# Patient Record
Sex: Female | Born: 1945 | Race: Black or African American | Hispanic: No | Marital: Single | Smoking: Never smoker
Health system: Southern US, Community
[De-identification: ages and names within clinical notes are randomized; demographics above are authoritative.]

## PROBLEM LIST (undated history)

## (undated) DIAGNOSIS — I5020 Unspecified systolic (congestive) heart failure: Secondary | ICD-10-CM

## (undated) DIAGNOSIS — D649 Anemia, unspecified: Secondary | ICD-10-CM

## (undated) DIAGNOSIS — E785 Hyperlipidemia, unspecified: Secondary | ICD-10-CM

## (undated) DIAGNOSIS — I2510 Atherosclerotic heart disease of native coronary artery without angina pectoris: Secondary | ICD-10-CM

## (undated) DIAGNOSIS — I255 Ischemic cardiomyopathy: Secondary | ICD-10-CM

## (undated) DIAGNOSIS — I10 Essential (primary) hypertension: Secondary | ICD-10-CM

## (undated) DIAGNOSIS — E119 Type 2 diabetes mellitus without complications: Secondary | ICD-10-CM

## (undated) HISTORY — DX: Type 2 diabetes mellitus without complications: E11.9

## (undated) HISTORY — DX: Anemia, unspecified: D64.9

## (undated) HISTORY — DX: Hyperlipidemia, unspecified: E78.5

## (undated) HISTORY — DX: Essential (primary) hypertension: I10

## (undated) HISTORY — DX: Unspecified systolic (congestive) heart failure: I50.20

## (undated) HISTORY — DX: Atherosclerotic heart disease of native coronary artery without angina pectoris: I25.10

## (undated) HISTORY — PX: CORONARY ANGIOPLASTY: SHX604

## (undated) HISTORY — DX: Ischemic cardiomyopathy: I25.5

## (undated) HISTORY — PX: CARDIAC CATHETERIZATION: SHX172

---

## 2015-12-27 HISTORY — PX: CORONARY ARTERY BYPASS GRAFT: SHX141

## 2016-06-16 ENCOUNTER — Ambulatory Visit (INDEPENDENT_AMBULATORY_CARE_PROVIDER_SITE_OTHER): Payer: Medicare Other | Admitting: Primary Care

## 2016-06-16 ENCOUNTER — Encounter: Payer: Self-pay | Admitting: Primary Care

## 2016-06-16 VITALS — BP 146/80 | HR 63 | Temp 98.0°F | Ht 61.75 in | Wt 166.8 lb

## 2016-06-16 DIAGNOSIS — Z7409 Other reduced mobility: Secondary | ICD-10-CM | POA: Diagnosis not present

## 2016-06-16 DIAGNOSIS — Z794 Long term (current) use of insulin: Secondary | ICD-10-CM

## 2016-06-16 DIAGNOSIS — E119 Type 2 diabetes mellitus without complications: Secondary | ICD-10-CM | POA: Diagnosis not present

## 2016-06-16 DIAGNOSIS — F418 Other specified anxiety disorders: Secondary | ICD-10-CM | POA: Diagnosis not present

## 2016-06-16 DIAGNOSIS — I2510 Atherosclerotic heart disease of native coronary artery without angina pectoris: Secondary | ICD-10-CM | POA: Diagnosis not present

## 2016-06-16 DIAGNOSIS — F329 Major depressive disorder, single episode, unspecified: Secondary | ICD-10-CM

## 2016-06-16 DIAGNOSIS — F419 Anxiety disorder, unspecified: Secondary | ICD-10-CM

## 2016-06-16 DIAGNOSIS — K219 Gastro-esophageal reflux disease without esophagitis: Secondary | ICD-10-CM

## 2016-06-16 NOTE — Progress Notes (Signed)
Subjective:    Patient ID: Megan Megan Howard, female    DOB: 12/12/1945, 70 y.o.   MRN: [id number]  HPI  Megan Megan Howard is a 70 year old female who presents today to establish care and discuss the problems mentioned below. Will obtain old records. [place] [facility] [place]. Two weeks ago she was living in [place] and was there for 1 week until moving to [place]. Her daughter is with her today as the patient is a poor historian. Her daughter doesn't know much about her medical history either.   1) Home Health Request: She was managed on home health in [place] and was provided with a home health nurse, physical/occupation therapy, home health aid. She needs help with medication management, ambulation, dressing, eating, physical conditioning. She uses a walker at home and is mostly home bound.   2) CAD/Essential Hypertension/CHF/MI: History of triple bypass surgery in June 2017. History of hypertension for years. She Currently managed on carvedilol 25 mg BID, enalapril 10 mg, hydralazine 25 mg. Also managed on lasix 40 mg daily and spironolactone 25 mg daily since her bypass in June 2017. She was following with cardiology in [place] but has not seen anyone recently.   She does have a history of lower extremity edema and is managed on Lasix and spironolactone. She sits in a dependent position for Megan Howard of the day and is not elevating her legs. Her daughter believes her swelling has become worse over the last several days. She is unsure if she was every diagnosed with CHF. She denies cough exertional dyspnea.  3) Type 2 Diabetes: Diagnosed years ago. Currently She checks her blood sugars 2-3 times daily mostly after meals. Her readings are running 150-250. She is managed on Lantus 10 units HS. She's had blood work in [place] 2-3 weeks ago and is unsure of her results. Her family is working to have her improve her diet but this has been a struggle.   She is managed on Megace for appetite stimulation  as she was not eating well in [place] after her bypass surgery in June 2017. She will snack a lot late at night and eats a lot of processed carbohydrates including biscuits. Her family is struggling with her diet as she refuses to eat what they prepare.   4) GERD: Currently managed on pantoprazole 40 mg. She will experience symptoms of esophageal burning, belching, epigastric discomfort without her medication.  5) Anticoagulation use: Currently managed on apixaban 5 mg. She is unsure of why she's taking this medication. She denies prior history of atrial fibrillation or CVA. She did undergo bypass surgery in June 2017.  6) Anxiety and Depression: Currently managed on Lexapro 10 mg for which was recently changed from Citalopram 2 weeks ago. She was previously managed on Lexapro with improvement but was switched to Citalopram during her hospitalization in June. Her daughter has noticed that she's screaming out her sister's name during the day and night. Her family is interested in therapy in order to restore her mood.   Review of Systems  Constitutional: Negative for unexpected weight change.  Respiratory: Negative for shortness of breath.   Cardiovascular: Positive for leg swelling. Negative for chest pain.  Gastrointestinal:       GERD  Endocrine: Negative for polyuria.  Genitourinary:       Post menopausal  Musculoskeletal: Negative for arthralgias.  Skin: Negative for color change and wound.  Neurological: Positive for weakness. Negative for dizziness and headaches.  Psychiatric/Behavioral: Positive for  sleep disturbance. The patient is nervous/anxious.        Depression       No past medical history on file.   Social History   Social History  . Marital status: Single    Spouse name: N/A  . Number of children: N/A  . Years of education: N/A   Occupational History  . Not on file.   Social History Main Topics  . Smoking status: Never Smoker  . Smokeless tobacco: Not on file    . Alcohol use No  . Drug use: Unknown  . Sexual activity: Not on file   Other Topics Concern  . Not on file   Social History Narrative   Single.   Moved from [place].   Now lives with daughter.     No past surgical history on file.  No family history on file.  Not on File  No current outpatient prescriptions on file prior to visit.   No current facility-administered medications on file prior to visit.     BP (!) 146/80   Pulse 63   Temp 98 F (36.7 C) (Oral)   Ht 5' 1.75" (1.568 m)   Wt 166 lb 12.8 oz (75.7 kg)   SpO2 93%   BMI 30.76 kg/m    Objective:   Physical Exam  Constitutional: She is oriented to person, place, and time. She appears well-nourished.  Neck: Neck supple.  Cardiovascular: Normal rate and regular rhythm.   Moderate lower extremity edema, trace pitting.  Pulmonary/Chest: Effort normal and breath sounds normal.  Musculoskeletal:  Decreased ROM to bilateral lower extremities, uses walker.  Neurological: She is alert and oriented to person, place, and time.  Skin: Skin is warm and dry.  Psychiatric:  Tearful during exam. Non verbal Megan Howard of the time. Poor eye contact.          Assessment & Plan:

## 2016-06-16 NOTE — Progress Notes (Signed)
Pre visit review using our clinic review tool, if applicable. No additional management support is needed unless otherwise documented below in the visit note. 

## 2016-06-16 NOTE — Patient Instructions (Addendum)
You will be contacted regarding your referral to Cardiology, therapy, and home health.  Please let us know if you have not heard back within one week.   Increase her furosemide (Lasix). Take 1 tablet by mouth twice daily for 3 days, then reduce down to 1 tablet by mouth once daily. Please notify me if no improvement.  Hold her megestrol 40 mg tablets as this is likely contributing to her eating binges at night.  I will obtain records from [place] and [place] and will be in touch once received.  It was a pleasure to meet you today! Please don't hesitate to call me with any questions. Welcome to [facility]!

## 2016-06-17 ENCOUNTER — Encounter: Payer: Self-pay | Admitting: Primary Care

## 2016-06-17 DIAGNOSIS — E119 Type 2 diabetes mellitus without complications: Secondary | ICD-10-CM | POA: Insufficient documentation

## 2016-06-17 DIAGNOSIS — Z7409 Other reduced mobility: Secondary | ICD-10-CM | POA: Insufficient documentation

## 2016-06-17 DIAGNOSIS — K219 Gastro-esophageal reflux disease without esophagitis: Secondary | ICD-10-CM | POA: Insufficient documentation

## 2016-06-17 DIAGNOSIS — F419 Anxiety disorder, unspecified: Secondary | ICD-10-CM

## 2016-06-17 DIAGNOSIS — I2510 Atherosclerotic heart disease of native coronary artery without angina pectoris: Secondary | ICD-10-CM | POA: Insufficient documentation

## 2016-06-17 DIAGNOSIS — F329 Major depressive disorder, single episode, unspecified: Secondary | ICD-10-CM | POA: Insufficient documentation

## 2016-06-17 NOTE — Assessment & Plan Note (Signed)
Significant decline in ADL's since bypass in June 2017. She would benefit from PT/OT for strength training. She also needs assistance with medications and ADL's. Referral placed to home health.

## 2016-06-17 NOTE — Assessment & Plan Note (Signed)
Managed on PPI, does experience symptoms of esophageal reflux without medication. Will obtain records, would like to get her on reduced dose if possible.

## 2016-06-17 NOTE — Assessment & Plan Note (Addendum)
Endorses labs drawn in [place] 2 weeks ago. Will obtain records from [place]. Continue Lantus 10 units at bedtime. Will have them hold Megace as this could be contributing to late night snacking.

## 2016-06-17 NOTE — Assessment & Plan Note (Addendum)
History of bypass in June 2017. Will establish her with a cardiologist locally. Continue beta blocker, anticoagulation, BP management. Poor historian, will obtain records for additional information. Will have her increase lasix for 3 days, then return to normal dose. Daughter will update if no improvement.

## 2016-06-17 NOTE — Assessment & Plan Note (Signed)
Recently switched from citalopram to lexapro 2 weeks ago. Did well on lexparo in the past. She does exhibit symptoms of depression today. Also question some anxiety, maybe other psychological disorders. Will continue Lexapro for now. Referral placed to therapy for further evaluation.

## 2016-06-23 ENCOUNTER — Telehealth: Payer: Self-pay | Admitting: Primary Care

## 2016-06-23 NOTE — Telephone Encounter (Signed)
Please call patient's daughter Camelia Engerri and notify her that we did not receive a signed medical release form. This must be completed in order for us to access records. Please have either her or her husband come by and sign. Please have the front office place this on my desk.

## 2016-06-23 NOTE — Telephone Encounter (Signed)
Gave medical release form to [place].

## 2016-06-24 ENCOUNTER — Telehealth: Payer: Self-pay

## 2016-06-24 NOTE — Telephone Encounter (Signed)
Received a call from [place] [facility]. The pt's daughter wanted home health to go through them. They are aware of the fact that she is still showing to be under the care of a different HH. That is the one she had in [place].

## 2016-06-24 NOTE — Telephone Encounter (Signed)
[facility] and patients daughter is aware. She needed to [facility] and have her mother discharged so she get services from a New agency.

## 2016-06-25 ENCOUNTER — Other Ambulatory Visit
Admission: RE | Admit: 2016-06-25 | Discharge: 2016-06-25 | Disposition: A | Discharge status: Home / Self Care | Payer: Medicare Other | Source: Ambulatory Visit | Attending: Internal Medicine | Admitting: Internal Medicine

## 2016-06-25 ENCOUNTER — Telehealth: Payer: Self-pay | Admitting: Primary Care

## 2016-06-25 ENCOUNTER — Ambulatory Visit (INDEPENDENT_AMBULATORY_CARE_PROVIDER_SITE_OTHER): Payer: Medicare Other | Admitting: Internal Medicine

## 2016-06-25 ENCOUNTER — Telehealth: Payer: Self-pay | Admitting: *Deleted

## 2016-06-25 ENCOUNTER — Encounter: Payer: Self-pay | Admitting: Internal Medicine

## 2016-06-25 ENCOUNTER — Telehealth: Payer: Self-pay | Admitting: Internal Medicine

## 2016-06-25 VITALS — BP 108/78 | HR 60 | Ht 61.0 in | Wt 167.0 lb

## 2016-06-25 DIAGNOSIS — I2510 Atherosclerotic heart disease of native coronary artery without angina pectoris: Secondary | ICD-10-CM | POA: Diagnosis not present

## 2016-06-25 DIAGNOSIS — F32A Depression, unspecified: Secondary | ICD-10-CM

## 2016-06-25 DIAGNOSIS — Z79899 Other long term (current) drug therapy: Secondary | ICD-10-CM | POA: Diagnosis not present

## 2016-06-25 DIAGNOSIS — I480 Paroxysmal atrial fibrillation: Secondary | ICD-10-CM | POA: Diagnosis not present

## 2016-06-25 DIAGNOSIS — F329 Major depressive disorder, single episode, unspecified: Secondary | ICD-10-CM

## 2016-06-25 DIAGNOSIS — I255 Ischemic cardiomyopathy: Secondary | ICD-10-CM | POA: Insufficient documentation

## 2016-06-25 LAB — CBC WITH DIFFERENTIAL/PLATELET
Basophils Absolute: 0 10*3/uL (ref 0–0.1)
Basophils Relative: 1 %
EOS PCT: 1 %
Eosinophils Absolute: 0.1 10*3/uL (ref 0–0.7)
HCT: 32.3 % — ABNORMAL LOW (ref 35.0–47.0)
HEMOGLOBIN: 10.3 g/dL — AB (ref 12.0–16.0)
LYMPHS ABS: 0.6 10*3/uL — AB (ref 1.0–3.6)
LYMPHS PCT: 10 %
MCH: 25.6 pg — AB (ref 26.0–34.0)
MCHC: 31.9 g/dL — ABNORMAL LOW (ref 32.0–36.0)
MCV: 80.1 fL (ref 80.0–100.0)
Monocytes Absolute: 0.4 10*3/uL (ref 0.2–0.9)
Monocytes Relative: 7 %
NEUTROS PCT: 81 %
Neutro Abs: 4.6 10*3/uL (ref 1.4–6.5)
Platelets: 143 10*3/uL — ABNORMAL LOW (ref 150–440)
RBC: 4.03 MIL/uL (ref 3.80–5.20)
RDW: 23.8 % — ABNORMAL HIGH (ref 11.5–14.5)
WBC: 5.6 10*3/uL (ref 3.6–11.0)

## 2016-06-25 LAB — COMPREHENSIVE METABOLIC PANEL
ALT: 13 U/L — ABNORMAL LOW (ref 14–54)
AST: 27 U/L (ref 15–41)
Albumin: 2.8 g/dL — ABNORMAL LOW (ref 3.5–5.0)
Alkaline Phosphatase: 156 U/L — ABNORMAL HIGH (ref 38–126)
Anion gap: 9 (ref 5–15)
BUN: 17 mg/dL (ref 6–20)
CHLORIDE: 102 mmol/L (ref 101–111)
CO2: 23 mmol/L (ref 22–32)
Calcium: 8.9 mg/dL (ref 8.9–10.3)
Creatinine, Ser: 1.18 mg/dL — ABNORMAL HIGH (ref 0.44–1.00)
GFR calc Af Amer: 52 mL/min — ABNORMAL LOW (ref >60)
GFR, EST NON AFRICAN AMERICAN: 45 mL/min — AB (ref >60)
Glucose, Bld: 157 mg/dL — ABNORMAL HIGH (ref 65–99)
POTASSIUM: 4.4 mmol/L (ref 3.5–5.1)
SODIUM: 134 mmol/L — AB (ref 135–145)
Total Bilirubin: 2.5 mg/dL — ABNORMAL HIGH (ref 0.3–1.2)
Total Protein: 8.2 g/dL — ABNORMAL HIGH (ref 6.5–8.1)

## 2016-06-25 LAB — BRAIN NATRIURETIC PEPTIDE: B Natriuretic Peptide: >4500 pg/mL — ABNORMAL HIGH (ref 0.0–100.0)

## 2016-06-25 MED ORDER — FUROSEMIDE 40 MG PO TABS: 40.0000 mg | ORAL_TABLET | Freq: Two times a day (BID) | ORAL | 5 refills | Status: DC

## 2016-06-25 NOTE — Patient Instructions (Addendum)
Medication Instructions:  Continue current medications.   Labwork: Your physician recommends that you return for lab work in: TODAY. (CMP, CBC, BNP)   Testing/Procedures: Your physician has requested that you have an echocardiogram. Echocardiography is a painless test that uses sound waves to create images of your heart. It provides your doctor with information about the size and shape of your heart and how well your heart's chambers and valves are working. This procedure takes approximately one hour. There are no restrictions for this procedure.  I WILL CALL YOU ABOUT THE TIME AND DATE FOR YOU ECHOCARDIOGRAM.  Follow-Up: Your physician recommends that you schedule a follow-up appointment in: 1 MONTH WITH DR END.   Any Other Special Instructions Will Be Listed Below (If Applicable). Echocardiogram An echocardiogram, or echocardiography, uses sound waves (ultrasound) to produce an image of your heart. The echocardiogram is simple, painless, obtained within a short period of time, and offers valuable information to your health care provider. The images from an echocardiogram can provide information such as:  Evidence of coronary artery disease (CAD).  Heart size.  Heart muscle function.  Heart valve function.  Aneurysm detection.  Evidence of a past heart attack.  Fluid buildup around the heart.  Heart muscle thickening.  Assess heart valve function. Tell a health care provider about:  Any allergies you have.  All medicines you are taking, including vitamins, herbs, eye drops, creams, and over-the-counter medicines.  Any problems you or family members have had with anesthetic medicines.  Any blood disorders you have.  Any surgeries you have had.  Any medical conditions you have.  Whether you are pregnant or may be pregnant. What happens before the procedure? No special preparation is needed. Eat and drink normally. What happens during the procedure?  In order to  produce an image of your heart, gel will be applied to your chest and a wand-like tool (transducer) will be moved over your chest. The gel will help transmit the sound waves from the transducer. The sound waves will harmlessly bounce off your heart to allow the heart images to be captured in real-time motion. These images will then be recorded.  You may need an IV to receive a medicine that improves the quality of the pictures. What happens after the procedure? You may return to your normal schedule including diet, activities, and medicines, unless your health care provider tells you otherwise. This information is not intended to replace advice given to you by your health care provider. Make sure you discuss any questions you have with your health care provider. Document Released: 07/11/2000 Document Revised: 03/01/2016 Document Reviewed: 03/21/2013 Elsevier Interactive Patient Education  2017 [employer]     If you need a refill on your cardiac medications before your next appointment, please call your pharmacy.

## 2016-06-25 NOTE — Telephone Encounter (Signed)
S/w with scheduling at Hospital and echo scheduled for 07/03/16 at 11:00.  S/w patient's daughter, ok per DPR, and she verbalized understanding of date and time of echo and to [facility] am [facility].

## 2016-06-25 NOTE — Progress Notes (Signed)
New Outpatient Visit Date: 06/25/2016  Referring Provider: Doreene NestKatherine K Clark, NP [address] [place], [place] [address]  Chief Complaint: Establish cardiology care  HPI:  [name] is a 70 y.o. year-old female with history of coronary artery disease status post CABG in 12/2015 in [place], possible cardiomyopathy, type 2 diabetes mellitus, and GERD, who has been referred by Dr. Chestine Sporelark for management of cardiovascular disease. The patient is accompanied by her daughter, who provides most of the history, as [name] is very tearful and only intermittently answers questions. She was first found to have heart problems in June after struggling with worsening "heartburn". This ultimately led to a cardiac catheterization with unsuccessful stent placement in the setting of an STEMI. The patient was then referred for CABG, receiving SVG to OM2 and SVG to RCA. Her postoperative course has been complicated by several issues. It sounds as though she was rehospitalized in [place] due to electrolyte disturbances from poor oral intake. She was ultimately discharged to inpatient rehabilitation, where she remained for about a month. After being discharged about a month ago, the patient spent one week in [place] before coming to [place]. She has been here for about 2-3 weeks and plans to reside here with her daughter.  The patient has not had any recent chest pain or shortness of breath. She has fluctuating leg swelling, which seems to be near its baseline today. The patient does not sleep in bed, often lying on the couch or on the floor. Her daughter is unsure of why this is. It should be noted that the patient sleeps very poorly at night and frequently cries out. She also reports a fall about one week ago but does not recall the circumstances. Her daughter noticed a small bump on her head after this event but did not identify any other significant injuries. The patient denies palpitations,  lightheadedness, and syncope. She is compliant with her medications, including aspirin and apixiban. It appears that anticoagulation was started due to postoperative atrial fibrillation following CABG. Reports from her cardiologist in the [place] suggests that subsequent Holter monitor did not show atrial fibrillation.  The patient has had a poor appetite and remains on megestrol. Of note, this was decreased from twice a day to once daily after her PCP visit last week. Patient reports frequent abdominal fullness and nausea.  --------------------------------------------------------------------------------------------------  Cardiovascular History & Procedures: Cardiovascular Problems:  Coronary artery disease  Ischemic cardiomyopathy (LVEF 67% by echo on 02/13/16)  Postoperative atrial fibrillation  Risk Factors:  Known coronary artery disease, diabetes mellitus, family history, and age greater than 6165  Cath/PCI:  Left heart cath 12/2015 in [place]. Results not available.  CV Surgery:  CABG (12/2015 in [place]): SVG to OM 2 and SVG to RCA.  EP Procedures and Devices:  Holter monitor (2017): Reportedly no evidence of atrial fibrillation.  Non-Invasive Evaluation(s):  TTE (02/13/16): Normal LV size and function with LVEF of 67%. Mild LVH present. 2+ tricuspid and 1+ pulmonic regurgitation present. Mild pulmonary hypertension.  Recent CV Pertinent Labs: See below --------------------------------------------------------------------------------------------------  Past Medical History:  Diagnosis Date  . Anemia   . Cardiomyopathy, ischemic   . Coronary artery disease   . Diabetes mellitus without complication (HCC)   . Dyslipidemia   . Hypertension     Past Surgical History:  Procedure Laterality Date  . CARDIAC CATHETERIZATION    . CORONARY ANGIOPLASTY    . CORONARY ARTERY BYPASS GRAFT  12/2015   CABG x 2  Outpatient Encounter Prescriptions as of 06/25/2016    Medication Sig  . apixaban (ELIQUIS) 5 MG TABS tablet Take 5 mg by mouth 2 (two) times daily.  Marland Kitchen. aspirin EC 81 MG tablet Take 81 mg by mouth daily.  . carvedilol (COREG) 25 MG tablet Take 25 mg by mouth 2 (two) times daily with a meal.  . enalapril (VASOTEC) 10 MG tablet Take 10 mg by mouth daily.  Marland Kitchen. escitalopram (LEXAPRO) 10 MG tablet Take 10 mg by mouth daily.  . furosemide (LASIX) 40 MG tablet Take 40 mg by mouth daily.  . hydrALAZINE (APRESOLINE) 25 MG tablet Take 25 mg by mouth 2 (two) times daily.  . insulin glargine (LANTUS) 100 UNIT/ML injection Inject 10 Units into the skin at bedtime.  Marland Kitchen. latanoprost (XALATAN) 0.005 % ophthalmic solution Place 1 drop into both eyes at bedtime.  . Magnesium Oxide 400 MG CAPS Take 400 mg by mouth daily.  . megestrol (MEGACE) 40 MG tablet Take 40 mg by mouth daily.  . pantoprazole (PROTONIX) 40 MG tablet Take 40 mg by mouth daily.  . potassium chloride SA (K-DUR,KLOR-CON) 20 MEQ tablet Take 20 mEq by mouth 2 (two) times daily.  Marland Kitchen. spironolactone (ALDACTONE) 25 MG tablet Take 25 mg by mouth daily.   No facility-administered encounter medications on file as of 06/25/2016.     Allergies: Shellfish allergy  Social History   Social History  . Marital status: Single    Spouse name: N/A  . Number of children: N/A  . Years of education: N/A   Occupational History  . Not on file.   Social History Main Topics  . Smoking status: Never Smoker  . Smokeless tobacco: Never Used  . Alcohol use No  . Drug use: No  . Sexual activity: Not on file   Other Topics Concern  . Not on file   Social History Narrative   Single.   Moved from [place].   Now lives with daughter.     Family History  Problem Relation Age of Onset  . Heart attack Sister   . Arrhythmia Brother     s/p AICD  . Cardiomyopathy Brother   . Diabetes type II Brother     Review of Systems: A 12-system review of systems was performed and was negative except as noted in the  HPI.  --------------------------------------------------------------------------------------------------  Physical Exam: BP 108/78 (BP Location: Right Arm, Patient Position: Sitting, Cuff Size: Normal)   Pulse 60   Ht 5\' 1"  (1.549 m)   Wt 167 lb (75.8 kg)   BMI 31.55 kg/m   General:  Overweight, elderly woman seated in a wheelchair. She is tearful. She is accompanied by her daughter. HEENT: Mild conjunctival pallor.  Moist mucous membranes.  OP clear. Neck: Supple without lymphadenopathy, thyromegaly, or carotid bruit. JVP is to the angle of the mandible seated upright with positive HJR. Lungs: Diminished breath sounds at the left lung base. No wheezes or crackles. Heart: Regular rate and rhythm with 2/6 holosystolic murmur heard across the precordium. No rubs or gallops. Abd: Bowel sounds present.  Soft, NT/ND. Unable to assess HSM due to body habitus and patient's inability to move from wheelchair to exam table. Ext: 2+ edema to the knees.  Radial pulses are 2+. Pedal pulses are trace but may be obscured by overlying edema. Skin: Dry, cracked skin noted on both lower extremities Neuro: CNIII-XII intact.  Patient not cooperative with further examination, though she spontaneously moves both upper and lower extremities. Psych: Depressed  mood with very flat affect. Patient is tearful throughout much of the visit.  EKG:  Normal sinus rhythm with left bundle branch block. No prior tracing available for comparison.  Outside labs (06/03/16): CMP: Sodium 136, potassium 4.3, chloride 100, CO2 21, BUN 21, creatinine 1.4, glucose 191, calcium 9.0, AST 32, ALT 17, alkaline phosphatase 152, total bilirubin 3.0, total protein 7.3, albumin 3.1  Hemoglobin A1c 6.8  TSH 3.34  White blood cell count 6.7, hemoglobin 9.4, hematocrit 30.7, platelets 215  --------------------------------------------------------------------------------------------------  ASSESSMENT AND PLAN: Coronary artery disease  status post CABG The patient does not have any obvious symptoms to suggest worsening coronary insufficiency. Her EKG today demonstrates a left bundle branch block, which is old based on review of her prior cardiology note. We will continue her current medication regimen, though it is unclear as to why she is not currently on a statin. We will check a complete metabolic panel today and consider addition of at least moderate intensity statin therapy given her history of CAD.  Ischemic cardiomyopathy The patient appears grossly volume overloaded on exam today. It is difficult to assess her functional status due to her profound psychomotor retardation, which is driven predominantly by depression. Echocardiogram following CABG supposedly showed preserved LV function, though the patient is currently on multiple heart failure medications including carvedilol, enalapril, and spironolactone. I am most concerned about right heart failure leading to her fluid retention. Her GI symptoms, including poor appetite, as well as elevated alkaline phosphatase and total bilirubin may be a reflection of venous congestion from right heart failure. We will obtain a transthoracic echocardiogram as soon as possible as well as request further records from her prior cardiologist in [place]. We will also check a CBC, CMP, and BNP today. We will not make any medication changes at this time.  Paroxysmal atrial fibrillation EKG today demonstrates sinus rhythm. The patient reportedly had postoperative atrial fibrillation without evidence of recurrence on Holter monitor by her prior cardiologist. We will request a complete record of her prior cardiology care. I would favor discontinuation of apxiban, given that her isolated episode of postoperative atrial fibrillation as well as her high risk for complications on anticoagulation. We will discuss this further after receiving her records.  Depression This seems to be the greatest barrier  to the patient's recovery. She is tearful throughout our visit today and exhibits a very flat affect. She is currently on Lexapro. I encouraged her and her daughter to seek out a psychiatrist or psychologist for further evaluation. We will also consider decreasing carvedilol after completion of her echo and labs, as this can sometimes exacerbate depression.  Follow-up: Return to clinic in one month.  Greater than 60 minutes was spent interviewing the patient and her daughter, examining the patient, and reviewing her outside records. More than 50% of this time was spent face-to-face with the patient and her daughter.  Yvonne Kendall, MD 06/25/2016 1:09 PM

## 2016-06-25 NOTE — Telephone Encounter (Signed)
I spoke with the patient's daughter regarding the results of her labs.  Given markedly elevated BNP and significant volume overload, we will increase furosemide to 40 mg BID.  I will also decrease carvedilol to 12.5 mg BID, given fatigue and depressed mood in the setting of low resting heart rate and blood pressure.  We will proceed with echocardiogram as previously scheduled and will discuss additional medication changes after reviewing her echo and outside records.  Yvonne Kendallhristopher Yoltzin Ransom, MD [facility] Pager: [phone]

## 2016-06-25 NOTE — Telephone Encounter (Signed)
Referral printed and placed in Amy's inbox. Amy, please return this to [place] when signed. Thanks!

## 2016-06-25 NOTE — Telephone Encounter (Signed)
[facility] - she need to have the referral cosigned by Dr Ermalene SearingBedsole since she has medicare Can you print the referral, have Dr Ermalene SearingBedsole sign it, and fax it back to [phone]  cb number for Efraim Kaufmannmelissa is [phone]  thank you

## 2016-06-26 NOTE — Telephone Encounter (Signed)
Signed and in my outbox.

## 2016-06-27 NOTE — Telephone Encounter (Signed)
Faxed the printed referral with signature as [facility] to [facility] at [phone]

## 2016-06-29 DIAGNOSIS — I10 Essential (primary) hypertension: Secondary | ICD-10-CM | POA: Diagnosis not present

## 2016-06-29 DIAGNOSIS — F329 Major depressive disorder, single episode, unspecified: Secondary | ICD-10-CM | POA: Diagnosis not present

## 2016-06-29 DIAGNOSIS — I2510 Atherosclerotic heart disease of native coronary artery without angina pectoris: Secondary | ICD-10-CM

## 2016-06-29 DIAGNOSIS — E119 Type 2 diabetes mellitus without complications: Secondary | ICD-10-CM | POA: Diagnosis not present

## 2016-07-01 ENCOUNTER — Telehealth: Payer: Self-pay | Admitting: Internal Medicine

## 2016-07-01 ENCOUNTER — Telehealth: Payer: Self-pay

## 2016-07-01 DIAGNOSIS — Z79899 Other long term (current) drug therapy: Secondary | ICD-10-CM

## 2016-07-01 MED ORDER — FUROSEMIDE 40 MG PO TABS: 80.0000 mg | ORAL_TABLET | Freq: Two times a day (BID) | ORAL | 5 refills | Status: DC

## 2016-07-01 NOTE — Telephone Encounter (Signed)
Home health nurse calling stating we upped her dose on Lasik last week  But it doesn't seem to be helping Pt legs are so heavy she is having an issue with walking about and would like to know what to do  170/110 was pt BP today   Please advise

## 2016-07-01 NOTE — Telephone Encounter (Signed)
[facility] Health RN), this was her first visit today to pt. She took measurements of legs today for first time. Patient and daughter still say legs are very heavy.  Per daughter, pt is NOT up going to the bathroom very often since recent lasix med changes and does not get up to move around very much at all. Denies SOB. BP 142/78 on Sunday when Home Health therapy was there. [facility] Nurse stated patient still seems very depressed. Patient is not up and moving around very much. Daughter is dispensing medication to patient as prescribed. No daily weights have been taken. Home health RN stated she will call the patient/family to see if they have a scale and to start daily weights.  Routing to Dr End for advice.

## 2016-07-01 NOTE — Telephone Encounter (Signed)
Bennetta LaosNancy Wilson OT with [facility] left v/m that pt fell at home on 06/29/16; pts daughter thinks pt got her feet hung up in bed sheets hanging off the bed; no apparent injury. FYI to Mayra ReelKate Clark NP.

## 2016-07-01 NOTE — Telephone Encounter (Signed)
S/w  Dr End who ordered for pt to increase furosemide to 80 mg BID, repeat BMP this week, and come for appt next Tuesday to see Dr End.  Left detailed message with daughter, Camelia Engerri, ok per DPR, with medication instructions and to call back to schedule repeat blood work and appt.

## 2016-07-01 NOTE — Telephone Encounter (Signed)
Received incoming call from daughter. Instructions for lasix, lab work, and appt given. She verbalized understanding. Patient will get BMP on Thursday when she is at the hospital for the echo. Appt scheduled for 07/08/16 at 2:45 pm to see Dr End.

## 2016-07-01 NOTE — Telephone Encounter (Signed)
S/w [facility]. Gave her Dr Serita KyleEnd's recommendations as well.  She read back and verbalized understanding. She will let the patient and daughter know as well.

## 2016-07-02 ENCOUNTER — Telehealth: Payer: Self-pay | Admitting: Primary Care

## 2016-07-02 NOTE — Telephone Encounter (Signed)
Noted. Please call and check on patient.

## 2016-07-02 NOTE — Telephone Encounter (Signed)
Message left for patient to return my call.  

## 2016-07-02 NOTE — Telephone Encounter (Signed)
Rec'd from Wandra ArthursLeonard Barrow Bourgeois MD forward 13 pages to NP Chestine Sporelark

## 2016-07-03 ENCOUNTER — Other Ambulatory Visit
Admission: RE | Admit: 2016-07-03 | Discharge: 2016-07-03 | Disposition: A | Discharge status: Home / Self Care | Payer: Medicare Other | Source: Ambulatory Visit | Attending: Internal Medicine | Admitting: Internal Medicine

## 2016-07-03 ENCOUNTER — Ambulatory Visit
Admission: RE | Admit: 2016-07-03 | Discharge: 2016-07-03 | Disposition: A | Discharge status: Home / Self Care | Payer: Medicare Other | Source: Ambulatory Visit | Attending: Internal Medicine | Admitting: Internal Medicine

## 2016-07-03 DIAGNOSIS — I071 Rheumatic tricuspid insufficiency: Secondary | ICD-10-CM | POA: Insufficient documentation

## 2016-07-03 DIAGNOSIS — I255 Ischemic cardiomyopathy: Secondary | ICD-10-CM | POA: Insufficient documentation

## 2016-07-03 DIAGNOSIS — I119 Hypertensive heart disease without heart failure: Secondary | ICD-10-CM | POA: Insufficient documentation

## 2016-07-03 DIAGNOSIS — I340 Nonrheumatic mitral (valve) insufficiency: Secondary | ICD-10-CM | POA: Diagnosis not present

## 2016-07-03 DIAGNOSIS — I2510 Atherosclerotic heart disease of native coronary artery without angina pectoris: Secondary | ICD-10-CM | POA: Insufficient documentation

## 2016-07-03 DIAGNOSIS — Z79899 Other long term (current) drug therapy: Secondary | ICD-10-CM | POA: Diagnosis not present

## 2016-07-03 DIAGNOSIS — E119 Type 2 diabetes mellitus without complications: Secondary | ICD-10-CM | POA: Diagnosis not present

## 2016-07-03 LAB — BASIC METABOLIC PANEL
ANION GAP: 12 (ref 5–15)
BUN: 19 mg/dL (ref 6–20)
CHLORIDE: 101 mmol/L (ref 101–111)
CO2: 21 mmol/L — AB (ref 22–32)
Calcium: 9 mg/dL (ref 8.9–10.3)
Creatinine, Ser: 1.31 mg/dL — ABNORMAL HIGH (ref 0.44–1.00)
GFR calc Af Amer: 46 mL/min — ABNORMAL LOW (ref >60)
GFR calc non Af Amer: 40 mL/min — ABNORMAL LOW (ref >60)
GLUCOSE: 240 mg/dL — AB (ref 65–99)
POTASSIUM: 3.9 mmol/L (ref 3.5–5.1)
Sodium: 134 mmol/L — ABNORMAL LOW (ref 135–145)

## 2016-07-03 NOTE — Telephone Encounter (Signed)
I've not received records. Can someone please check on the status? I requested them from both [place] and [place].

## 2016-07-03 NOTE — Telephone Encounter (Signed)
Spoken to patient's daughter. She stated that patient is okay.  Patient's daughter wanted to know have Megan Howard received patient's medical records yet. She would like to know what is the next step and when should patient come see Megan Howard again.

## 2016-07-03 NOTE — Progress Notes (Signed)
*  PRELIMINARY RESULTS* Echocardiogram 2D Echocardiogram has been performed.  Cristela BlueHege, Taesean Reth 07/03/2016, 11:46 AM

## 2016-07-03 NOTE — Telephone Encounter (Signed)
Per DPR, left detal message for patient of Kate's comments.

## 2016-07-04 NOTE — Telephone Encounter (Signed)
Noted. Received records. Reviewed scanned records.

## 2016-07-04 NOTE — Telephone Encounter (Signed)
I called the offices where we requested records.  Dr.Jones office said they received the request,but wouldn't let me know when records would be sent.  Dr.Freyou's office said they faxed the records to us on 06/25/16.  [facility] and [facility] returned them to us today.  I'll put them in Kate's in box.  Dr.Braunsteins's office faxed records to us today.  I'll put them in Kate's in box.

## 2016-07-08 ENCOUNTER — Encounter: Payer: Self-pay | Admitting: Internal Medicine

## 2016-07-08 ENCOUNTER — Ambulatory Visit (INDEPENDENT_AMBULATORY_CARE_PROVIDER_SITE_OTHER): Payer: Medicare Other | Admitting: Internal Medicine

## 2016-07-08 VITALS — BP 163/88 | HR 65 | Ht 62.0 in | Wt 153.8 lb

## 2016-07-08 DIAGNOSIS — I10 Essential (primary) hypertension: Secondary | ICD-10-CM

## 2016-07-08 DIAGNOSIS — I5021 Acute systolic (congestive) heart failure: Secondary | ICD-10-CM

## 2016-07-08 DIAGNOSIS — Z79899 Other long term (current) drug therapy: Secondary | ICD-10-CM

## 2016-07-08 DIAGNOSIS — I480 Paroxysmal atrial fibrillation: Secondary | ICD-10-CM

## 2016-07-08 DIAGNOSIS — I2510 Atherosclerotic heart disease of native coronary artery without angina pectoris: Secondary | ICD-10-CM

## 2016-07-08 DIAGNOSIS — I255 Ischemic cardiomyopathy: Secondary | ICD-10-CM

## 2016-07-08 NOTE — Progress Notes (Signed)
Follow-up Outpatient Visit Date: 07/08/2016  Chief Complaint: Follow-up leg swelling  HPI:  Megan Howard is a 70 y.o. year-old female with history of coronary artery disease status post CABG in 12/2015 in [place], possible cardiomyopathy, type 2 diabetes mellitus, and GERD, who presents for follow-up of leg swelling due to systolic heart failure. I first met the patient on 06/25/16, a which time she complained of significant leg swelling as well as progressive fatigue. She was also found to be quite depressed since moving to [place]. At that visit, labs revealed markedly elevated BNP prompting [place] to increase her furosemide. Despite this, she continued to have sluggish urine output with persistent swelling leading [place] to further increase furosemide 80 mg twice a day. Subsequent echocardiogram revealed severe LV and moderate RV dysfunction. Should be noted that her EF was reportedly normal on outside echocardiogram in July following CABG. After the echo, we also decreased carvedilol to 12.5 mg twice a day due to patient fatigue.  Today, the patient notes that her leg swelling has improved. Her daughter and son-in-law feel that the patient has had more energy as well. She denies chest pain, shortness of breath, and orthopnea, but continues to feel quite sluggish. She has been taking furosemide 80 mg twice a day with brisk urine output. She has lost 10-15 pounds since our last visit. She denies lightheadedness and palpitations. The patient's daughter is concerned about possible side effects of carvedilol, though as noted above, the patient has seemed improved somewhat since decreasing from 25 mg twice a day to 12.5 mg twice a day.  --------------------------------------------------------------------------------------------------  Cardiovascular History & Procedures: Cardiovascular Problems:  Coronary artery disease  Ischemic cardiomyopathy (LVEF 67% by echo on 02/13/16)  Postoperative atrial  fibrillation  Risk Factors:  Known coronary artery disease, diabetes mellitus, family history, and age greater than 65  Cath/PCI:  Left heart cath 12/2015 in [place]. Results not available.  CV Surgery:  CABG (12/2015 in [place]): SVG to OM 2 and SVG to RCA.  EP Procedures and Devices:  Holter monitor (2017): Reportedly no evidence of atrial fibrillation.  Non-Invasive Evaluation(s):  TTE (07/03/16): Mildly dilated LV with severely reduced contraction (EF 30-35%). There is diffuse hypokinesis with severe hypokinesis of the anterior and anteroseptal walls. 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 There is mild left atrial enlargement. RV size is moderately enlarged with moderately reduced function. There is moderate TR. Moderate pulmonary hypertension also evident.  TTE (02/13/16): Normal LV size and function with LVEF of 67%. Mild LVH present. 2+ tricuspid and 1+ pulmonic regurgitation present. Mild pulmonary hypertension.  Recent CV Pertinent Labs: Lab Results  Component Value Date   INR WILL FOLLOW 07/08/2016   BNP >4,500.0 (H) 06/25/2016   K 4.7 07/08/2016   BUN 19 07/08/2016   CREATININE 1.19 (H) 07/08/2016    Past medical and surgical history were reviewed and updated in EPIC.   Outpatient Encounter Prescriptions as of 07/08/2016  Medication Sig  . apixaban (ELIQUIS) 5 MG TABS tablet Take 5 mg by mouth 2 (two) times daily.  Marland Kitchen aspirin EC 81 MG tablet Take 81 mg by mouth daily.  . carvedilol (COREG) 25 MG tablet Take 25 mg by mouth 2 (two) times daily with a meal.  . enalapril (VASOTEC) 10 MG tablet Take 10 mg by mouth daily.  Marland Kitchen escitalopram (LEXAPRO) 10 MG tablet Take 10 mg by mouth daily.  . furosemide (LASIX) 40 MG tablet Take 2 tablets (80 mg total) by mouth 2 (two)  times daily.  . hydrALAZINE (APRESOLINE) 25 MG tablet Take 25 mg by mouth 2 (two) times daily.  . insulin glargine (LANTUS) 100 UNIT/ML injection Inject 10 Units into the skin at bedtime.    Marland Kitchen latanoprost (XALATAN) 0.005 % ophthalmic solution Place 1 drop into both eyes at bedtime.  . Magnesium Oxide 400 MG CAPS Take 400 mg by mouth daily.  . pantoprazole (PROTONIX) 40 MG tablet Take 40 mg by mouth daily.  . potassium chloride SA (K-DUR,KLOR-CON) 20 MEQ tablet Take 20 mEq by mouth 2 (two) times daily.  Marland Kitchen spironolactone (ALDACTONE) 25 MG tablet Take 25 mg by mouth daily.  . [DISCONTINUED] megestrol (MEGACE) 40 MG tablet Take 40 mg by mouth daily.   No facility-administered encounter medications on file as of 07/08/2016.     Allergies: Shellfish allergy  Social History   Social History  . Marital status: Single    Spouse name: N/A  . Number of children: N/A  . Years of education: N/A   Occupational History  . Not on file.   Social History Main Topics  . Smoking status: Never Smoker  . Smokeless tobacco: Never Used  . Alcohol use No  . Drug use: No  . Sexual activity: Not on file   Other Topics Concern  . Not on file   Social History Narrative   Single.   Moved from [place].   Now lives with daughter.     Family History  Problem Relation Age of Onset  . Heart attack Sister   . Arrhythmia Brother     s/p AICD  . Cardiomyopathy Brother   . Diabetes type II Brother     Review of Systems: A 12-system review of systems was performed and was negative except as noted in the HPI.  --------------------------------------------------------------------------------------------------  Physical Exam: BP (!) 163/88 (BP Location: Left Arm, Patient Position: Sitting, Cuff Size: Normal)   Pulse 65   Ht 5' 2"  (1.575 m)   Wt 153 lb 12 oz (69.7 kg)   BMI 28.12 kg/m   General:  Overweight woman seated comfortably in wheelchair. She continues to be withdrawn and tearful at times during the interview. HEENT: No conjunctival pallor or scleral icterus.  Moist mucous membranes.  OP clear. Neck: Supple without lymphadenopathy, thyromegaly. JVP present. Lungs: Normal  work of breathing.  Clear to auscultation bilaterally without wheezes or crackles. Heart: Regular rate and rhythm with 2/6 holosystolic murmur across the precordium. No rubs or gallops. Abd: Bowel sounds present.  Soft, NT/ND without hepatosplenomegaly Ext: 2+ pretibial edema bilaterally.  Radial, PT, and DP pulses are 2+ bilaterally. Skin: warm and dry without rash   Lab Results  Component Value Date   WBC WILL FOLLOW 07/08/2016   HGB 10.3 (L) 06/25/2016   HCT WILL FOLLOW 07/08/2016   MCV WILL FOLLOW 07/08/2016   PLT WILL FOLLOW 07/08/2016    Lab Results  Component Value Date   NA 133 (L) 07/08/2016   K 4.7 07/08/2016   CL 96 07/08/2016   CO2 23 07/08/2016   BUN 19 07/08/2016   CREATININE 1.19 (H) 07/08/2016   GLUCOSE 154 (H) 07/08/2016   ALT 13 (L) 06/25/2016   --------------------------------------------------------------------------------------------------  ASSESSMENT AND PLAN: Severe systolic heart failure: Patient has diuresed well with increase in furosemide 80 mg twice a day. She continues to appear volume overloaded. Assessment of her functional status is difficult, but she is likely still NYHA class III-IV. Echocardiogram showed significant decline in biventricular function since July. I am concerned  that this may represent graft failure after her recent CABG. We have discussed further treatment options and have agreed to continue with diuresis and attempt to optimize her volume status. We will continue carvedilol 12.5 mg twice a day, as the patient's energy has improved somewhat with de-escalation. I will check a basic metabolic panel today. If her renal function and potassium allow, I will increase enalapril to 20 mg twice a day given her uncontrolled hypertension. I advised the patient to minimize her salt intake. Once her volume status is improved, we will need to proceed with left and right heart catheterization.  Coronary artery disease: The patient is a have any  chest pain. However, given her heart failure with newly discovered reduced EF, I am concerned for an ischemic etiology. We will continue with aspirin and apixaban, though ultimately I would favor discontinuation of apixaban if there is no further evidence of atrial fibrillation. We are still awaiting records from the patient's hospitalizations in [place].  Hypertension: Blood pressure is uncontrolled today. We will check a basic metabolic panel and consider increasing enalapril, as renal function and potassium allow.  Paroxysmal atrial fibrillation: Patient has a regular rhythm today. She reportedly had postoperative atrial fibrillation without recurrence of a Holter monitor by her prior cardiologist. We are awaiting further outside records, though ultimately favor discontinuation of apixaban. We will readdress this at her next visit.  Follow-up: Return to clinic in one week.  Nelva Bush, MD 07/09/2016 9:46 AM

## 2016-07-08 NOTE — Patient Instructions (Signed)
Medication Instructions:  Your physician recommends that you continue on your current medications as directed. Please refer to the Current Medication list given to you today.   Labwork: Your physician recommends that you return for lab work in: TODAY (BMP).   Testing/Procedures: none  Follow-Up: Your physician recommends that you schedule a follow-up appointment in: 1 WEEK WITH DR END (NEXT TUES OR WED)   If you need a refill on your cardiac medications before your next appointment, please call your pharmacy.

## 2016-07-09 ENCOUNTER — Telehealth: Payer: Self-pay | Admitting: *Deleted

## 2016-07-09 LAB — SPECIMEN STATUS

## 2016-07-09 MED ORDER — POTASSIUM CHLORIDE CRYS ER 20 MEQ PO TBCR: 20.0000 meq | EXTENDED_RELEASE_TABLET | Freq: Every day | ORAL | 3 refills | Status: DC

## 2016-07-09 MED ORDER — ENALAPRIL MALEATE 10 MG PO TABS: 20.0000 mg | ORAL_TABLET | Freq: Two times a day (BID) | ORAL | 0 refills | Status: DC

## 2016-07-09 MED ORDER — ENALAPRIL MALEATE 10 MG PO TABS: 10.0000 mg | ORAL_TABLET | Freq: Two times a day (BID) | ORAL | 2 refills | Status: DC

## 2016-07-09 NOTE — Addendum Note (Signed)
Addended by: Stann MainlandLARK, Dakia Schifano O on: 07/09/2016 02:07 PM   Modules accepted: Orders

## 2016-07-09 NOTE — Telephone Encounter (Signed)
-----   Message from Yvonne Kendallhristopher End, MD sent at 07/09/2016  9:50 AM EST ----- Please let Ms. Schupp and her daughter know that the patient's renal function and potassium are fine (renal function has actually improved slightly since increasing furosemide).  Please have her continue with furosemide 80 mg BID.  Please also have her increase enalapril to 10 mg BID and decrease potassium chloride to 20 mEq once daily.  We will see her back next week, as planned.  Thanks.

## 2016-07-09 NOTE — Telephone Encounter (Signed)
Patient's daughter called back to let us know the patient was already taking enalapril 10 mg by mouth twice a day. Advised to continue taking twice a day as prescribed. Will route to Dr End to let him know.

## 2016-07-09 NOTE — Telephone Encounter (Signed)
Patient's daughter, Camelia Engerri, called back. Results and medications changes given to her and she read back and verbalized understanding.

## 2016-07-09 NOTE — Telephone Encounter (Signed)
S/w patient's daughter. She verbalized understanding to have patient take Enalapril 20 mg by mouth twice a day. SHe said they had plenty of 10mg  pills left at this time and she will give her 2 of the 10 mg tablets twice a day to make sure she can tolerate without lightheadedness.  She will let us know when she needs a refill.

## 2016-07-09 NOTE — Telephone Encounter (Signed)
Patient's daughter called back to let us know the patient was already taking enalapril

## 2016-07-09 NOTE — Telephone Encounter (Signed)
Thanks for the update.  I thought she was only on daily dosing.  In that case, can we increase it to 20 mg BID?  If the patient becomes lightheaded, she should return to 10 mg BID and let us know.  We will f/u again next week in the office.  Thanks.

## 2016-07-10 ENCOUNTER — Encounter: Payer: Self-pay | Admitting: Internal Medicine

## 2016-07-10 ENCOUNTER — Telehealth: Payer: Self-pay | Admitting: Internal Medicine

## 2016-07-10 DIAGNOSIS — I5021 Acute systolic (congestive) heart failure: Secondary | ICD-10-CM | POA: Insufficient documentation

## 2016-07-10 DIAGNOSIS — I10 Essential (primary) hypertension: Secondary | ICD-10-CM | POA: Insufficient documentation

## 2016-07-10 LAB — BASIC METABOLIC PANEL
BUN/Creatinine Ratio: 16 (ref 12–28)
BUN: 19 mg/dL (ref 8–27)
CO2: 23 mmol/L (ref 18–29)
CREATININE: 1.19 mg/dL — AB (ref 0.57–1.00)
Calcium: 9 mg/dL (ref 8.7–10.3)
Chloride: 96 mmol/L (ref 96–106)
GFR calc Af Amer: 53 mL/min/{1.73_m2} — ABNORMAL LOW (ref >59)
GFR, EST NON AFRICAN AMERICAN: 46 mL/min/{1.73_m2} — AB (ref >59)
GLUCOSE: 154 mg/dL — AB (ref 65–99)
Potassium: 4.7 mmol/L (ref 3.5–5.2)
Sodium: 133 mmol/L — ABNORMAL LOW (ref 134–144)

## 2016-07-10 LAB — SPECIMEN STATUS REPORT

## 2016-07-10 NOTE — Telephone Encounter (Signed)
Error

## 2016-07-15 ENCOUNTER — Ambulatory Visit: Payer: Medicare Other | Admitting: Internal Medicine

## 2016-07-15 ENCOUNTER — Telehealth: Payer: Self-pay

## 2016-07-15 ENCOUNTER — Other Ambulatory Visit: Payer: Self-pay | Admitting: *Deleted

## 2016-07-15 ENCOUNTER — Encounter: Payer: Self-pay | Admitting: Emergency Medicine

## 2016-07-15 ENCOUNTER — Emergency Department: Payer: Medicare Other

## 2016-07-15 ENCOUNTER — Telehealth: Payer: Self-pay | Admitting: Internal Medicine

## 2016-07-15 ENCOUNTER — Emergency Department
Admission: EM | Admit: 2016-07-15 | Discharge: 2016-07-15 | Disposition: A | Discharge status: Home / Self Care | Payer: Medicare Other | Attending: Emergency Medicine | Admitting: Emergency Medicine

## 2016-07-15 DIAGNOSIS — I110 Hypertensive heart disease with heart failure: Secondary | ICD-10-CM | POA: Diagnosis not present

## 2016-07-15 DIAGNOSIS — Z951 Presence of aortocoronary bypass graft: Secondary | ICD-10-CM | POA: Insufficient documentation

## 2016-07-15 DIAGNOSIS — E119 Type 2 diabetes mellitus without complications: Secondary | ICD-10-CM | POA: Insufficient documentation

## 2016-07-15 DIAGNOSIS — Z794 Long term (current) use of insulin: Secondary | ICD-10-CM | POA: Diagnosis not present

## 2016-07-15 DIAGNOSIS — Z79899 Other long term (current) drug therapy: Secondary | ICD-10-CM | POA: Insufficient documentation

## 2016-07-15 DIAGNOSIS — I25810 Atherosclerosis of coronary artery bypass graft(s) without angina pectoris: Secondary | ICD-10-CM | POA: Insufficient documentation

## 2016-07-15 DIAGNOSIS — N939 Abnormal uterine and vaginal bleeding, unspecified: Secondary | ICD-10-CM | POA: Diagnosis not present

## 2016-07-15 DIAGNOSIS — I5021 Acute systolic (congestive) heart failure: Secondary | ICD-10-CM | POA: Diagnosis not present

## 2016-07-15 DIAGNOSIS — Z7982 Long term (current) use of aspirin: Secondary | ICD-10-CM | POA: Insufficient documentation

## 2016-07-15 LAB — CBC
HEMATOCRIT: 31.9 % — AB (ref 35.0–47.0)
HEMOGLOBIN: 10.5 g/dL — AB (ref 12.0–16.0)
MCH: 27.1 pg (ref 26.0–34.0)
MCHC: 32.9 g/dL (ref 32.0–36.0)
MCV: 82.4 fL (ref 80.0–100.0)
PLATELETS: 218 10*3/uL (ref 150–440)
RBC: 3.87 MIL/uL (ref 3.80–5.20)
RDW: 20 % — ABNORMAL HIGH (ref 11.5–14.5)
WBC: 4 10*3/uL (ref 3.6–11.0)

## 2016-07-15 LAB — BASIC METABOLIC PANEL
ANION GAP: 9 (ref 5–15)
BUN: 22 mg/dL — ABNORMAL HIGH (ref 6–20)
CHLORIDE: 101 mmol/L (ref 101–111)
CO2: 21 mmol/L — AB (ref 22–32)
Calcium: 8.8 mg/dL — ABNORMAL LOW (ref 8.9–10.3)
Creatinine, Ser: 1.23 mg/dL — ABNORMAL HIGH (ref 0.44–1.00)
GFR calc Af Amer: 50 mL/min — ABNORMAL LOW (ref >60)
GFR, EST NON AFRICAN AMERICAN: 43 mL/min — AB (ref >60)
GLUCOSE: 196 mg/dL — AB (ref 65–99)
POTASSIUM: 4.3 mmol/L (ref 3.5–5.1)
Sodium: 131 mmol/L — ABNORMAL LOW (ref 135–145)

## 2016-07-15 MED ORDER — FUROSEMIDE 80 MG PO TABS: 80.0000 mg | ORAL_TABLET | Freq: Two times a day (BID) | ORAL | 3 refills | Status: DC

## 2016-07-15 NOTE — Telephone Encounter (Signed)
Terri left v/m (DPR signed) pt has had vaginal bleeding on and off last week but consistent vaginal bleeding from 07/11/16 through today.  I was unable to reach  Terri by phone but spoke with Mr Julaine Fusiotten [facility](DPR signed) and he does not think pt is having abd pain and does not know how much bleeding pt is presently doing but understands pt has been bleeding since Fri. Due to pts age will take pt to ED for eval.

## 2016-07-15 NOTE — Telephone Encounter (Signed)
Patient could be evaluated in the office. Please set her up with the earliest 30 minute appointment.

## 2016-07-15 NOTE — ED Provider Notes (Signed)
[facility] Emergency Department Provider Note  Time seen: 6:14 PM  I have reviewed the triage vital signs and the nursing notes.   HISTORY  Chief Complaint Vaginal Bleeding    HPI Megan Howard is a 70 y.o. female the past medical history of diabetes, hypertension, hyperlipidemia, presents the emergency department for vaginal bleeding. According to the patientand family for the past one week the patient has been having vaginal bleeding/spotting. Patient states she recently moved here from [place] and does not have a gynecologist in the area so she came to the emergency department for evaluation. Patient is somewhat tearful during evaluation however the family states this is normal for her. Patient denies any pain. Denies any abdominal or pelvic pain. Patient states she has had vaginal bleeding previously approximately one year ago when she believes that she saw a gynecologist but she is not sure but states nothing was done about it. Patient denies any lightheadedness or dizziness.   Past Medical History:  Diagnosis Date  . Anemia   . Cardiomyopathy, ischemic   . Coronary artery disease   . Diabetes mellitus without complication (HCC)   . Dyslipidemia   . Hypertension     Patient Active Problem List   Diagnosis Date Noted  . Acute systolic heart failure (HCC) 07/10/2016  . Essential hypertension 07/10/2016  . Ischemic cardiomyopathy 06/25/2016  . Paroxysmal atrial fibrillation (HCC) 06/25/2016  . Coronary artery disease involving native heart without angina pectoris 06/17/2016  . Anxiety and depression 06/17/2016  . Impaired mobility and ADLs 06/17/2016  . Type 2 diabetes mellitus (HCC) 06/17/2016  . GERD (gastroesophageal reflux disease) 06/17/2016    Past Surgical History:  Procedure Laterality Date  . CARDIAC CATHETERIZATION    . CORONARY ANGIOPLASTY    . CORONARY ARTERY BYPASS GRAFT  12/2015   CABG x 2    Prior to Admission medications    Medication Sig Start Date End Date Taking? Authorizing Provider  apixaban (ELIQUIS) 5 MG TABS tablet Take 5 mg by mouth 2 (two) times daily.    Historical Provider, MD  aspirin EC 81 MG tablet Take 81 mg by mouth daily.    Historical Provider, MD  carvedilol (COREG) 25 MG tablet Take 12.5 mg by mouth 2 (two) times daily with a meal.    Historical Provider, MD  enalapril (VASOTEC) 10 MG tablet Take 2 tablets (20 mg total) by mouth 2 (two) times daily. 07/09/16   Christopher End, MD  escitalopram (LEXAPRO) 10 MG tablet Take 10 mg by mouth daily.    Historical Provider, MD  furosemide (LASIX) 80 MG tablet Take 1 tablet (80 mg total) by mouth 2 (two) times daily. 07/15/16   Yvonne Kendall, MD  hydrALAZINE (APRESOLINE) 25 MG tablet Take 25 mg by mouth 2 (two) times daily.    Historical Provider, MD  insulin glargine (LANTUS) 100 UNIT/ML injection Inject 10 Units into the skin at bedtime.    Historical Provider, MD  latanoprost (XALATAN) 0.005 % ophthalmic solution Place 1 drop into both eyes at bedtime.    Historical Provider, MD  Magnesium Oxide 400 MG CAPS Take 400 mg by mouth daily.    Historical Provider, MD  pantoprazole (PROTONIX) 40 MG tablet Take 40 mg by mouth daily.    Historical Provider, MD  potassium chloride SA (K-DUR,KLOR-CON) 20 MEQ tablet Take 1 tablet (20 mEq total) by mouth daily. 07/09/16   Yvonne Kendall, MD  spironolactone (ALDACTONE) 25 MG tablet Take 25 mg by mouth daily.  Historical Provider, MD    Allergies  Allergen Reactions  . Shellfish Allergy     Face swelling    Family History  Problem Relation Age of Onset  . Heart attack Sister   . Arrhythmia Brother     s/p AICD  . Cardiomyopathy Brother   . Diabetes type II Brother     Social History Social History  Substance Use Topics  . Smoking status: Never Smoker  . Smokeless tobacco: Never Used  . Alcohol use No    Review of Systems Constitutional: Negative for fever. Cardiovascular: Negative for  chest pain. Respiratory: Negative for shortness of breath. Gastrointestinal: Negative for abdominal pain Genitourinary: Positive for vaginal bleeding. Neurological: Negative for headache 10-point ROS otherwise negative.  ____________________________________________   PHYSICAL EXAM:  VITAL SIGNS: ED Triage Vitals  Enc Vitals Group     BP 07/15/16 1604 (!) 157/82     Pulse Rate 07/15/16 1604 69     Resp 07/15/16 1604 20     Temp 07/15/16 1604 98.3 F (36.8 C)     Temp Source 07/15/16 1604 Oral     SpO2 07/15/16 1604 97 %     Weight 07/15/16 1604 146 lb (66.2 kg)     Height 07/15/16 1604 5\' 5"  (1.651 m)     Head Circumference --      Peak Flow --      Pain Score 07/15/16 1743 0     Pain Loc --      Pain Edu? --      Excl. in GC? --     Constitutional: Alert and oriented. Well appearing and in no distress. Eyes: Normal exam ENT   Head: Normocephalic and atraumatic.   Mouth/Throat: Mucous membranes are moist. Cardiovascular: Normal rate, regular rhythm. No murmur Respiratory: Normal respiratory effort without tachypnea nor retractions. Breath sounds are clear  Gastrointestinal: Soft and nontender. No distention. Genitourinary: Pelvic examination shows no obvious vaginal trauma. Patient does have mild vaginal bleeding from the cervical os, no cervical masses/lesions noted. Musculoskeletal: Nontender with normal range of motion in all extremities.  Neurologic:  Normal speech and language. No gross focal neurologic deficits  Skin:  Skin is warm, dry and intact.  Psychiatric: Mood and affect are normal.   ____________________________________________     RADIOLOGY  Ultrasound consistent with thickened heterogeneous endometrium, endometrial biopsy recommended.  ____________________________________________   INITIAL IMPRESSION / ASSESSMENT AND PLAN / ED COURSE  Pertinent labs & imaging results that were available during my care of the patient were reviewed by me  and considered in my medical decision making (see chart for details).  Patient presents for vaginal bleeding for the past one week. Patient's pelvic examination shows mild vaginal bleeding from the cervical os, no lesions or masses noted. Ultrasound shows a thickened heterogeneous endometrium. Given the vaginal bleeding her age and ultrasound results the patient will need OB/GYN follow-up for endometrial biopsy. I discussed this plan. With the patient and she is agreeable. Patient will follow-up as an outpatient.  ____________________________________________   FINAL CLINICAL IMPRESSION(S) / ED DIAGNOSES  Vaginal bleeding    Minna AntisKevin Lional Icenogle, MD 07/15/16 [phone]

## 2016-07-15 NOTE — Telephone Encounter (Signed)
Requested Prescriptions   Signed Prescriptions Disp Refills  . furosemide (LASIX) 80 MG tablet 180 tablet 3    Sig: Take 1 tablet (80 mg total) by mouth 2 (two) times daily.    Authorizing Provider: END, CHRISTOPHER    Ordering User: Kendrick FriesLOPEZ, Olusegun Gerstenberger C

## 2016-07-15 NOTE — Discharge Instructions (Signed)
Please call the number provided for OB/GYN to arrange a follow-up appointment as soon as possible regarding your vaginal bleeding.

## 2016-07-15 NOTE — ED Triage Notes (Signed)
Patient presents to the ED with vaginal bleeding x 1 week.  Patient states her daughter notified her pcp prior to today.  Patient states she is changing pads every 3-4 hours.  Patient is tearful in triage and family member states this is patient's baseline-she has been depressed for the past 4 months.  Patient denies vaginal pain.

## 2016-07-15 NOTE — Telephone Encounter (Signed)
Spoken to Camelia Engerri (patient's daughter) and scheduled appt on 07/16/2016.

## 2016-07-15 NOTE — Telephone Encounter (Signed)
°*  STAT* If patient is at the pharmacy, call can be transferred to refill team.   1. Which medications need to be refilled? (please list name of each medication and dose if known) fluid pills  2. Which pharmacy/location (including street and city if local pharmacy) is medication to be sent to? [facility] [place]   3. Do they need a 30 day or 90 day supply? 90 day

## 2016-07-15 NOTE — Telephone Encounter (Signed)
Requested Prescriptions   Signed Prescriptions Disp Refills  . furosemide (LASIX) 80 MG tablet 180 tablet 3    Sig: Take 1 tablet (80 mg total) by mouth 2 (two) times daily.    Authorizing Provider: END, CHRISTOPHER    Ordering User: LOPEZ, MARINA C    

## 2016-07-15 NOTE — Telephone Encounter (Signed)
Message left for patient's daughter to return my call.

## 2016-07-15 NOTE — ED Notes (Signed)
Pt c/o vaginal bleeding xfew days. Pt is tearful during assessment. Pt A&Ox4, denies any pain, dizziness, weakness. Pt States she wants to go home.

## 2016-07-16 ENCOUNTER — Ambulatory Visit (INDEPENDENT_AMBULATORY_CARE_PROVIDER_SITE_OTHER): Payer: Medicare Other | Admitting: Internal Medicine

## 2016-07-16 ENCOUNTER — Other Ambulatory Visit: Payer: Self-pay | Admitting: Obstetrics

## 2016-07-16 ENCOUNTER — Encounter: Payer: Self-pay | Admitting: Internal Medicine

## 2016-07-16 ENCOUNTER — Encounter: Payer: Self-pay | Admitting: Primary Care

## 2016-07-16 ENCOUNTER — Ambulatory Visit (INDEPENDENT_AMBULATORY_CARE_PROVIDER_SITE_OTHER): Payer: Medicare Other | Admitting: Primary Care

## 2016-07-16 VITALS — BP 160/86 | HR 66 | Ht 65.0 in | Wt 144.0 lb

## 2016-07-16 VITALS — BP 146/80 | HR 85 | Temp 98.8°F | Ht 61.75 in | Wt 143.8 lb

## 2016-07-16 DIAGNOSIS — F32A Depression, unspecified: Secondary | ICD-10-CM

## 2016-07-16 DIAGNOSIS — I5023 Acute on chronic systolic (congestive) heart failure: Secondary | ICD-10-CM

## 2016-07-16 DIAGNOSIS — I480 Paroxysmal atrial fibrillation: Secondary | ICD-10-CM | POA: Diagnosis not present

## 2016-07-16 DIAGNOSIS — I255 Ischemic cardiomyopathy: Secondary | ICD-10-CM

## 2016-07-16 DIAGNOSIS — F418 Other specified anxiety disorders: Secondary | ICD-10-CM | POA: Diagnosis not present

## 2016-07-16 DIAGNOSIS — F329 Major depressive disorder, single episode, unspecified: Secondary | ICD-10-CM

## 2016-07-16 DIAGNOSIS — I10 Essential (primary) hypertension: Secondary | ICD-10-CM | POA: Diagnosis not present

## 2016-07-16 DIAGNOSIS — N939 Abnormal uterine and vaginal bleeding, unspecified: Secondary | ICD-10-CM | POA: Diagnosis not present

## 2016-07-16 DIAGNOSIS — F419 Anxiety disorder, unspecified: Principal | ICD-10-CM

## 2016-07-16 DIAGNOSIS — I2510 Atherosclerotic heart disease of native coronary artery without angina pectoris: Secondary | ICD-10-CM | POA: Diagnosis not present

## 2016-07-16 MED ORDER — ESCITALOPRAM OXALATE 20 MG PO TABS: 20.0000 mg | ORAL_TABLET | Freq: Every day | ORAL | 0 refills | Status: DC

## 2016-07-16 MED ORDER — ISOSORBIDE DINITRATE 20 MG PO TABS: 20.0000 mg | ORAL_TABLET | Freq: Three times a day (TID) | ORAL | 5 refills | Status: DC

## 2016-07-16 NOTE — Patient Instructions (Signed)
Medication Instructions:  Your physician has recommended you make the following change in your medication:  1- STOP taking Eliquis. 2- START Isosorbide Dinitrate 20 mg (1 tablet) by mouth twice a day.   Labwork: Your physician recommends that you return for lab work in: TODAY (BMP, TSH).   Follow-Up: Your physician recommends that you schedule a follow-up appointment in: AS SCHEDULED WITH DR END IN January.   If you need a refill on your cardiac medications before your next appointment, please call your pharmacy.

## 2016-07-16 NOTE — Assessment & Plan Note (Signed)
Tearful today upon exam, no improvement since re-initiation of Lexapro by prior PCP in November before [place] [facility]. Will increase Lexapro to 20 mg daily. Discussed referral for therapy, they've not yet heard back. Will inquire. Follow up in 6 weeks for re-evaluation of depression.

## 2016-07-16 NOTE — Progress Notes (Signed)
Subjective:    Patient ID: Megan Howard, female    DOB: 08/04/1945, 70 y.o.   MRN: [id number]  HPI  [name] is a 70 year old female who presents today for hospital follow up.  She presented to [facility] ED on 07/15/16 with a chief complaint of vaginal bleeding. She is managed on apixaban twice daily for paroxysmal atrial fibrillation. Her vaginal bleeding has been present for the past 1 week.   During her stay in the ED she underwent pelvic examination which showed mild vaginal bleeding. She underwent pelvic and transvaginal ultrasound which showed probable fibroid and endometrium to be heterogeneous and thickened for age. Recommendations for endometrial sampling was suggested to rule out carcinoma. She underwent lab work which showed stable anemia without evidence of acute infection. She was discharged home with recommendations for OB/GYN evaluation.   Since her discharge home she continues to notice vaginal bleeding. She is wearing pads and is going through 2-3 pads daily. Her bleeding is bright red.  2) Depression: Currently managed on Lexapro 10 mg that was recently restarted in early November 2017. During her visit in November she was tearful and expressed symptoms of depression. Her medication had just been re-initiated so no changes were made. Today she's feeling depressed and unhappy since moving from [place]. Her husband is with her today who just moved from [place] yesterday.   Review of Systems  Constitutional: Negative for fatigue.  Respiratory: Negative for shortness of breath.   Cardiovascular: Negative for chest pain.       Lower extremity edema improved  Genitourinary: Positive for vaginal bleeding. Negative for dysuria, frequency, pelvic pain, vaginal discharge and vaginal pain.  Hematological:       Depression  Psychiatric/Behavioral: Positive for sleep disturbance. The patient is nervous/anxious.        Past Medical History:  Diagnosis Date  . Anemia   .  Cardiomyopathy, ischemic   . Coronary artery disease   . Diabetes mellitus without complication (HCC)   . Dyslipidemia   . Hypertension      Social History   Social History  . Marital status: Single    Spouse name: N/A  . Number of children: N/A  . Years of education: N/A   Occupational History  . Not on file.   Social History Main Topics  . Smoking status: Never Smoker  . Smokeless tobacco: Never Used  . Alcohol use No  . Drug use: No  . Sexual activity: Not on file   Other Topics Concern  . Not on file   Social History Narrative   Single.   Moved from [place].   Now lives with daughter.     Past Surgical History:  Procedure Laterality Date  . CARDIAC CATHETERIZATION    . CORONARY ANGIOPLASTY    . CORONARY ARTERY BYPASS GRAFT  12/2015   CABG x 2    Family History  Problem Relation Age of Onset  . Heart attack Sister   . Arrhythmia Brother     s/p AICD  . Cardiomyopathy Brother   . Diabetes type II Brother     Allergies  Allergen Reactions  . Shellfish Allergy     Face swelling    Current Outpatient Prescriptions on File Prior to Visit  Medication Sig Dispense Refill  . apixaban (ELIQUIS) 5 MG TABS tablet Take 5 mg by mouth 2 (two) times daily.    Marland Kitchen. aspirin EC 81 MG tablet Take 81 mg by mouth daily.    .Marland Kitchen  carvedilol (COREG) 25 MG tablet Take 25 mg by mouth 2 (two) times daily with a meal.     . enalapril (VASOTEC) 10 MG tablet Take 2 tablets (20 mg total) by mouth 2 (two) times daily. 60 tablet 0  . furosemide (LASIX) 80 MG tablet Take 1 tablet (80 mg total) by mouth 2 (two) times daily. 180 tablet 3  . hydrALAZINE (APRESOLINE) 25 MG tablet Take 25 mg by mouth 2 (two) times daily.    . insulin glargine (LANTUS) 100 UNIT/ML injection Inject 10 Units into the skin at bedtime.    Marland Kitchen. latanoprost (XALATAN) 0.005 % ophthalmic solution Place 1 drop into both eyes at bedtime.    . Magnesium Oxide 400 MG CAPS Take 400 mg by mouth daily.    . pantoprazole  (PROTONIX) 40 MG tablet Take 40 mg by mouth daily.    . potassium chloride SA (K-DUR,KLOR-CON) 20 MEQ tablet Take 1 tablet (20 mEq total) by mouth daily. 90 tablet 3  . spironolactone (ALDACTONE) 25 MG tablet Take 25 mg by mouth daily.     No current facility-administered medications on file prior to visit.     BP (!) 146/80   Pulse 85   Temp 98.8 F (37.1 C) (Oral)   Ht 5' 1.75" (1.568 m)   Wt 143 lb 12.8 oz (65.2 kg)   SpO2 96%   BMI 26.51 kg/m    Objective:   Physical Exam  Constitutional: She appears well-nourished.  Cardiovascular: Normal rate and regular rhythm.   Pulmonary/Chest: Effort normal and breath sounds normal.  Skin: Skin is warm and dry.  Psychiatric:  Tearful during exam, poor eye contact, does not speak much          Assessment & Plan:

## 2016-07-16 NOTE — Progress Notes (Signed)
Pre visit review using our clinic review tool, if applicable. No additional management support is needed unless otherwise documented below in the visit note. 

## 2016-07-16 NOTE — Assessment & Plan Note (Signed)
Present for the past 1 week. Evaluated in the ED yesterday.  US revealed endometrial thickening vs fibroid as cause. Urgent referral placed to GYN for her to be seen this week. CBC with stable anemia.  All hospital notes, labs, and imaging reviewed.

## 2016-07-16 NOTE — Progress Notes (Signed)
Follow-up Outpatient Visit Date: 07/16/2016  Chief Complaint: Follow-up leg swelling  HPI:  Megan Howard is a 70 y.o. year-old female with history of coronary artery disease status post CABG in 12/2015 in [place], possible cardiomyopathy, type 2 diabetes mellitus, and GERD, who presents for follow-up of leg swelling due to systolic heart failure. She underwent CABG in 12/2015; her postoperative course was complicated by rehospitalization and a lengthy stay in rehab.  She moved to [place] to be with her daughter this fall.  Since arriving here, the patient has had significant swelling as well as fatigue and marked depression.  Echocardiogram performed after our first visit revealed biventricular dysfunction with LVEF of 30-35%.  She was significantly volume overloaded on exam, prompting [place] to proceed with aggressive diuresis.  Since our last visit a week ago, the patient developed vaginal bleeding.  She was evaluated in the ED, where pelvic ultrasound revealed uterine fibroids and endometrial thickening. The patient provides only limited information today, as she is tearful and minimally conversant. She complains only of leg pain and tightness. Her family reports that she continues to have significant swelling in both legs, though her weight has declined further since our last visit. She has not had any chest pain or shortness of breath.  --------------------------------------------------------------------------------------------------  Cardiovascular History & Procedures: Cardiovascular Problems:  Coronary artery disease  Ischemic cardiomyopathy  Postoperative atrial fibrillation  Risk Factors:  Known coronary artery disease, diabetes mellitus, family history, and age greater than 39  Cath/PCI:  Left heart cath 12/2015 in [place]. Results not available.  CV Surgery:  CABG (12/2015 in [place]): SVG to OM 2 and SVG to RCA.  EP Procedures and Devices:  Holter monitor (2017):  Reportedly no evidence of atrial fibrillation.  Non-Invasive Evaluation(s):  TTE (07/03/16): Mildly dilated LV with severely reduced contraction (EF 30-35%). There is diffuse hypokinesis with severe hypokinesis of the anterior and anteroseptal walls. 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 There is mild left atrial enlargement. RV size is moderately enlarged with moderately reduced function. There is moderate TR. Moderate pulmonary hypertension also evident.  TTE (02/13/16): Normal LV size and function with LVEF of 67%. Mild LVH present. 2+ tricuspid and 1+ pulmonic regurgitation present. Mild pulmonary hypertension.  Recent CV Pertinent Labs: Lab Results  Component Value Date   INR WILL FOLLOW 07/08/2016   BNP >4,500.0 (H) 06/25/2016   K 4.3 07/15/2016   BUN 22 (H) 07/15/2016   BUN 19 07/08/2016   CREATININE 1.23 (H) 07/15/2016    Past medical and surgical history were reviewed and updated in EPIC.   Outpatient Encounter Prescriptions as of 07/16/2016  Medication Sig  . apixaban (ELIQUIS) 5 MG TABS tablet Take 5 mg by mouth 2 (two) times daily.  Marland Kitchen aspirin EC 81 MG tablet Take 81 mg by mouth daily.  . carvedilol (COREG) 25 MG tablet Take 25 mg by mouth 2 (two) times daily with a meal.   . enalapril (VASOTEC) 10 MG tablet Take 2 tablets (20 mg total) by mouth 2 (two) times daily.  Marland Kitchen escitalopram (LEXAPRO) 20 MG tablet Take 1 tablet (20 mg total) by mouth daily.  . furosemide (LASIX) 80 MG tablet Take 1 tablet (80 mg total) by mouth 2 (two) times daily.  . hydrALAZINE (APRESOLINE) 25 MG tablet Take 25 mg by mouth 2 (two) times daily.  . insulin glargine (LANTUS) 100 UNIT/ML injection Inject 10 Units into the skin at bedtime.  Marland Kitchen latanoprost (XALATAN) 0.005 % ophthalmic solution Place 1 drop  into both eyes at bedtime.  . Magnesium Oxide 400 MG CAPS Take 400 mg by mouth daily.  . pantoprazole (PROTONIX) 40 MG tablet Take 40 mg by mouth daily.  . potassium chloride SA  (K-DUR,KLOR-CON) 20 MEQ tablet Take 1 tablet (20 mEq total) by mouth daily.  Marland Kitchen. spironolactone (ALDACTONE) 25 MG tablet Take 25 mg by mouth daily.   No facility-administered encounter medications on file as of 07/16/2016.     Allergies: Shellfish allergy  Social History   Social History  . Marital status: Single    Spouse name: N/A  . Number of children: N/A  . Years of education: N/A   Occupational History  . Not on file.   Social History Main Topics  . Smoking status: Never Smoker  . Smokeless tobacco: Never Used  . Alcohol use No  . Drug use: No  . Sexual activity: Not on file   Other Topics Concern  . Not on file   Social History Narrative   Single.   Moved from [place].   Now lives with daughter.     Family History  Problem Relation Age of Onset  . Heart attack Sister   . Arrhythmia Brother     s/p AICD  . Cardiomyopathy Brother   . Diabetes type II Brother     Review of Systems: Attempted but unable to complete as the patient becomes tearful and refuses to answer most questions.  --------------------------------------------------------------------------------------------------  Physical Exam: BP (!) 160/86 (BP Location: Right Arm, Patient Position: Sitting, Cuff Size: Normal)   Pulse 66   Ht 5\' 5"  (1.651 m)   Wt 144 lb (65.3 kg)   BMI 23.96 kg/m   General:  Overweight woman seated comfortably in wheelchair. She seems more tearful today than last week. HEENT: No conjunctival pallor or scleral icterus.  Moist mucous membranes.  OP clear. Neck: Supple without lymphadenopathy, thyromegaly. JVP approximately 8-10 cm with positive HJR. Lungs: Normal work of breathing.  Clear to auscultation bilaterally without wheezes or crackles. Heart: Regular rate and rhythm with 2/6 holosystolic murmur across the precordium. No rubs or gallops. Abd: Bowel sounds present.  Soft, NT/ND without hepatosplenomegaly Ext: 2+ pretibial edema bilaterally.  Radial, PT, and DP  pulses are 2+ bilaterally. Skin: warm and dry without rash   Lab Results  Component Value Date   WBC 4.0 07/15/2016   HGB 10.5 (L) 07/15/2016   HCT 31.9 (L) 07/15/2016   MCV 82.4 07/15/2016   PLT 218 07/15/2016    Lab Results  Component Value Date   NA 131 (L) 07/15/2016   K 4.3 07/15/2016   CL 101 07/15/2016   CO2 21 (L) 07/15/2016   BUN 22 (H) 07/15/2016   CREATININE 1.23 (H) 07/15/2016   GLUCOSE 196 (H) 07/15/2016   ALT 13 (L) 06/25/2016   --------------------------------------------------------------------------------------------------  ASSESSMENT AND PLAN: Severe systolic heart failure: The patient continues to diurese well and has lost an additional 11 pounds since last week. Overall, she is down 22 pounds since her first visit on 06/16/16. She is tolerating enalapril 20 mg twice a day well, though the patient's daughter is concerned that some of her medications may be contributing to Megan Howard continued depression. As her blood pressure remains elevated today, we have agreed to add isosorbide dinitrate 20 mg 3 times a day to her regimen. We will continue current doses of carvedilol, enalapril, hydralazine, spironolactone and furosemide. At some point, we may consider switching en[facility], though cost could be an issue.  We again discussed the timing of cardiac catheterization, but in light of her recent vaginal bleeding, we will defer this until she has been evaluated by gynecology.  Coronary artery disease: There is no clear evidence of chest pain or shortness of breath, though the patient's depression makes eliciting symptoms difficult. We will continue her current medications, as above, with the addition of isosorbide dinitrate.  Hypertension: Blood pressure remains significantly elevated despite increase in enalapril. We will add isosorbide dinitrate today. I will check a basic metabolic panel today , given continued aggressive diuresis and escalation of ACE  inhibitor last visit.  Paroxysmal atrial fibrillation: Heart rate is regular, consistent with prior EKGs documenting sinus rhythm. Holter monitor worn after CABG did not show any evidence of atrial fibrillation. In light of her new vaginal bleeding, we have agreed to discontinue apixaban at this time. The patient should remain on low-dose aspirin given her history of CAD.  We will check a TSH today.  Depression: The patient continues to appear profoundly depressed with limited engagement and frequent crying during today's visit. I again encouraged her and her family to seek psychiatric help, as I fear that long-term cardiac care will be difficult without the patient's ability to participate in rehabilitation. Lexapro was recently increased. She should follow-up with her PCP for continued management until a psychiatry provider can be established.  Follow-up: Return to clinic on 08/01/16.  Yvonne Kendallhristopher Letizia Hook, MD 07/16/2016 2:22 PM

## 2016-07-16 NOTE — Patient Instructions (Signed)
We've increased your Lexapro from 10 mg to 20 mg. You may take two of the 10 mg tablets until your current bottle is empty. I've sent in a new prescription for the 20 mg tablets to your pharmacy. Take one 20 mg tablet once daily.  Stop by the front desk and speak with either Shirlee LimerickMarion regarding your referral to GYN for further evaluation of vaginal bleeding.  Please schedule a follow up appointment with me in 6 weeks for re-evaluation of anxiety and depression and any additional lab work that may be due.  Please tell Terri that I will be home all next week and to call me if she needs anything.  It was a pleasure to see you today!

## 2016-07-17 LAB — BASIC METABOLIC PANEL
BUN / CREAT RATIO: 15 (ref 12–28)
BUN: 21 mg/dL (ref 8–27)
CALCIUM: 8.9 mg/dL (ref 8.7–10.3)
CHLORIDE: 100 mmol/L (ref 96–106)
CO2: 20 mmol/L (ref 18–29)
Creatinine, Ser: 1.37 mg/dL — ABNORMAL HIGH (ref 0.57–1.00)
GFR, EST AFRICAN AMERICAN: 44 mL/min/{1.73_m2} — AB (ref >59)
GFR, EST NON AFRICAN AMERICAN: 39 mL/min/{1.73_m2} — AB (ref >59)
Glucose: 149 mg/dL — ABNORMAL HIGH (ref 65–99)
POTASSIUM: 4.6 mmol/L (ref 3.5–5.2)
Sodium: 136 mmol/L (ref 134–144)

## 2016-07-17 LAB — TSH: TSH: 2.95 u[IU]/mL (ref 0.450–4.500)

## 2016-07-25 ENCOUNTER — Telehealth: Payer: Self-pay

## 2016-07-25 NOTE — Telephone Encounter (Signed)
Megan HaltStacy David PT with [facility] left v/m;has been working with pt in the home for PT and pt is beginning to show progress; pt to be discharged from PT next week but request extension of PT home health orders. Please advise.

## 2016-07-25 NOTE — Telephone Encounter (Signed)
Okay to give verbal order for extension of Pt as requested.

## 2016-07-25 NOTE — Telephone Encounter (Signed)
Left message on Stacy's voicemail giving the verbal ok as authorized by Dr. Ermalene SearingBedsole.

## 2016-07-29 ENCOUNTER — Ambulatory Visit: Payer: Medicare Other | Admitting: Primary Care

## 2016-07-30 ENCOUNTER — Telehealth: Payer: Self-pay | Admitting: Internal Medicine

## 2016-07-30 ENCOUNTER — Encounter: Payer: Self-pay | Admitting: Primary Care

## 2016-07-30 MED ORDER — PANTOPRAZOLE SODIUM 40 MG PO TBEC: 40.0000 mg | DELAYED_RELEASE_TABLET | Freq: Every day | ORAL | 1 refills | Status: DC

## 2016-07-30 MED ORDER — MAGNESIUM OXIDE 400 MG PO CAPS: 400.0000 mg | ORAL_CAPSULE | Freq: Every day | ORAL | 1 refills | Status: DC

## 2016-07-30 MED ORDER — HYDRALAZINE HCL 25 MG PO TABS: 25.0000 mg | ORAL_TABLET | Freq: Two times a day (BID) | ORAL | 1 refills | Status: DC

## 2016-07-30 MED ORDER — SPIRONOLACTONE 25 MG PO TABS: 25.0000 mg | ORAL_TABLET | Freq: Every day | ORAL | 1 refills | Status: DC

## 2016-07-30 NOTE — Telephone Encounter (Signed)
Pt daughter calling   *STAT* If patient is at the pharmacy, call can be transferred to refill team.   1. Which medications need to be refilled? (please list name of each medication and dose if known)  Hydralazine Pantoprazole  Spironolactone  Magnesium oxide    2. Which pharmacy/location (including street and city if local pharmacy) is medication to be sent to? [facility]   3. Do they need a 30 day or 90 day supply?  90 day

## 2016-07-30 NOTE — Telephone Encounter (Signed)
Patient is requesting refills on the following: Hydralazine Spironolactone Magnesium Oxide  Pantoprazole  I know patient recently moved to the area. Please advise as to which ones you would like us to refill with your name.

## 2016-07-30 NOTE — Telephone Encounter (Signed)
LMOVM Pt needs to contact original Prescribe or PCP.

## 2016-07-30 NOTE — Telephone Encounter (Signed)
Pt requesting refill on medications listed below please advise if ok to refill.

## 2016-07-30 NOTE — Telephone Encounter (Signed)
You can refill all of them.

## 2016-07-31 NOTE — Telephone Encounter (Signed)
S/w with dpr, Terri. She stated she got in touch with patient's PCP who refilled the medications for her. At this time she does not need anymore refills.

## 2016-08-01 ENCOUNTER — Encounter: Payer: Self-pay | Admitting: Internal Medicine

## 2016-08-01 ENCOUNTER — Ambulatory Visit (INDEPENDENT_AMBULATORY_CARE_PROVIDER_SITE_OTHER): Payer: Medicare Other | Admitting: Internal Medicine

## 2016-08-01 VITALS — BP 118/64 | HR 66 | Ht 63.0 in | Wt 136.8 lb

## 2016-08-01 DIAGNOSIS — F32A Depression, unspecified: Secondary | ICD-10-CM

## 2016-08-01 DIAGNOSIS — Z13220 Encounter for screening for lipoid disorders: Secondary | ICD-10-CM

## 2016-08-01 DIAGNOSIS — I5022 Chronic systolic (congestive) heart failure: Secondary | ICD-10-CM | POA: Diagnosis not present

## 2016-08-01 DIAGNOSIS — I480 Paroxysmal atrial fibrillation: Secondary | ICD-10-CM | POA: Diagnosis not present

## 2016-08-01 DIAGNOSIS — Z5181 Encounter for therapeutic drug level monitoring: Secondary | ICD-10-CM

## 2016-08-01 DIAGNOSIS — I2510 Atherosclerotic heart disease of native coronary artery without angina pectoris: Secondary | ICD-10-CM

## 2016-08-01 DIAGNOSIS — I10 Essential (primary) hypertension: Secondary | ICD-10-CM | POA: Diagnosis not present

## 2016-08-01 DIAGNOSIS — F329 Major depressive disorder, single episode, unspecified: Secondary | ICD-10-CM

## 2016-08-01 DIAGNOSIS — Z79899 Other long term (current) drug therapy: Secondary | ICD-10-CM

## 2016-08-01 DIAGNOSIS — G4700 Insomnia, unspecified: Secondary | ICD-10-CM

## 2016-08-01 MED ORDER — ATORVASTATIN CALCIUM 20 MG PO TABS: 20.0000 mg | ORAL_TABLET | Freq: Every day | ORAL | 3 refills | Status: DC

## 2016-08-01 MED ORDER — FUROSEMIDE 40 MG PO TABS: 40.0000 mg | ORAL_TABLET | Freq: Every day | ORAL | 3 refills | Status: DC

## 2016-08-01 NOTE — Patient Instructions (Addendum)
Medication Instructions:  Your physician has recommended you make the following change in your medication:  1- STOP taking your Potassium. 2- DECREASE furosemide to 40mg  by mouth once a day.  If weight is greater than 2 lbs in 24 hours or greater than 5 lbs in 1 week, Take furosemide 40mg  by mouth twice a day until you return to your baseline weight.  3. START Atorvastatin 20 mg  (1 tablet) by mouth once a day in the evening with dinner.   Labwork: Your physician recommends that you return for lab work in: TODAY (BMP).   Testing/Procedures: Your physician has requested that you have an LIMITED echocardiogram. Echocardiography is a painless test that uses sound waves to create images of your heart. It provides your doctor with information about the size and shape of your heart and how well your heart's chambers and valves are working. This procedure takes approximately one hour. There are no restrictions for this procedure.    Follow-Up: Your physician recommends that you schedule a follow-up appointment in: 1 MONTH WITH DR END AFTER YOU HAVE HAD THE LIMITED ECHO.   If you need a refill on your cardiac medications before your next appointment, please call your pharmacy.

## 2016-08-01 NOTE — Progress Notes (Addendum)
Follow-up Outpatient Visit Date: 08/01/2016  Chief Complaint: Follow-up leg swelling  HPI:  Megan Howard is a 71 y.o. year-old female with history of coronary artery disease status post CABG in 12/2015 in [place], ischemic cardiomyopathy, postoperative atrial fibrillation, type 2 diabetes mellitus, and GERD, who presents for follow-up of leg swelling due to systolic heart failure. She underwent CABG in 12/2015; her postoperative course was complicated by rehospitalization and a lengthy stay in rehab.  She moved to [place] to be with her daughter this fall.  I first met her in late November, 2017, at which time she was markedly volume overloaded.  Echocardiogram performed after our first visit revealed biventricular dysfunction with LVEF of 30-35%.  We have since aggressively diuresed and Megan Howard and optimized her heart failure regimen. Today, she reports feeling much better with resolution of her edema. She denies chest pain, shortness of breath, palpitations, lightheadedness, orthopnea, and PND. She is now able to climb stairs. The patient's daughter reports that furosemide was decreased to 80 mg daily due to lack of swelling earlier this week.  The patient's depression has also improved significantly. Her Lexapro has been increased to 20 mg daily. She is still waiting to be evaluated by a psychologist through her PCP's office.  --------------------------------------------------------------------------------------------------  Cardiovascular History & Procedures: Cardiovascular Problems:  Coronary artery disease  Ischemic cardiomyopathy  Postoperative atrial fibrillation  Risk Factors:  Known coronary artery disease, diabetes mellitus, family history, and age greater than 9  Cath/PCI:  LHC (12/29/15, [facility]): LMCA with mild luminal irregularities. LAD with mild proximal disease and 99% apical stenosis. Codominant LCx with 80% mid vessel stenosis. Small, codominant RCA with  99% mid and 75-80% distal stenoses. Unsuccessful PCI to mid RCA due to inability to expand stenosis and deployed stent. 80 and 90% residual stenosis remains. LVEDP 21 mmHg. LVEF 45% with inferior hypokinesis.  CV Surgery:  CABG (12/2015 in [place]): SVG to OM2 and SVG to RCA.  EP Procedures and Devices:  Holter monitor (2017): Reportedly no evidence of atrial fibrillation.  Non-Invasive Evaluation(s):  TTE (07/03/16): Mildly dilated LV with severely reduced contraction (EF 30-35%). There is diffuse hypokinesis with severe hypokinesis of the anterior and anteroseptal walls. 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 There is mild left atrial enlargement. RV size is moderately enlarged with moderately reduced function. There is moderate TR. Moderate pulmonary hypertension also evident.  TTE (04/20/16): Mild LVH with LVEF 40% and moderate global hypokinesis. Restrictive filling pattern noted. Decreased RV contraction with evidence of RV volume overload. Aortic sclerosis. Mild mitral and moderate tricuspid regurgitation. Moderate to severe pulmonary hypertension.  TTE (02/13/16): Normal LV size and function with LVEF of 67%. Mild LVH present. 2+ tricuspid and 1+ pulmonic regurgitation present. Mild pulmonary hypertension.  Recent CV Pertinent Labs: Lab Results  Component Value Date   INR WILL FOLLOW 07/08/2016   BNP >4,500.0 (H) 06/25/2016   K 4.6 07/16/2016   BUN 21 07/16/2016   CREATININE 1.37 (H) 07/16/2016    Past medical and surgical history were reviewed and updated in EPIC.   Outpatient Encounter Prescriptions as of 08/01/2016  Medication Sig  . aspirin EC 81 MG tablet Take 81 mg by mouth daily.  . carvedilol (COREG) 25 MG tablet Take 12.5 mg by mouth 2 (two) times daily with a meal.   . enalapril (VASOTEC) 10 MG tablet Take 2 tablets (20 mg total) by mouth 2 (two) times daily.  Marland Kitchen escitalopram (LEXAPRO) 20 MG tablet Take 1 tablet (  20 mg total) by mouth daily.  . furosemide  (LASIX) 80 MG tablet Take 1 tablet (80 mg total) by mouth 2 (two) times daily.  . hydrALAZINE (APRESOLINE) 25 MG tablet Take 1 tablet (25 mg total) by mouth 2 (two) times daily.  . insulin glargine (LANTUS) 100 UNIT/ML injection Inject 10 Units into the skin at bedtime.  . isosorbide dinitrate (ISORDIL) 20 MG tablet Take 1 tablet (20 mg total) by mouth 3 (three) times daily.  Marland Kitchen latanoprost (XALATAN) 0.005 % ophthalmic solution Place 1 drop into both eyes at bedtime.  . Magnesium Oxide 400 MG CAPS Take 1 capsule (400 mg total) by mouth daily.  . pantoprazole (PROTONIX) 40 MG tablet Take 1 tablet (40 mg total) by mouth daily.  . potassium chloride SA (K-DUR,KLOR-CON) 20 MEQ tablet Take 1 tablet (20 mEq total) by mouth daily.  Marland Kitchen spironolactone (ALDACTONE) 25 MG tablet Take 1 tablet (25 mg total) by mouth daily.   No facility-administered encounter medications on file as of 08/01/2016.     Allergies: Shellfish allergy  Social History   Social History  . Marital status: Single    Spouse name: N/A  . Number of children: N/A  . Years of education: N/A   Occupational History  . Not on file.   Social History Main Topics  . Smoking status: Never Smoker  . Smokeless tobacco: Never Used  . Alcohol use No  . Drug use: No  . Sexual activity: Not on file   Other Topics Concern  . Not on file   Social History Narrative   Single.   Moved from [place].   Now lives with daughter.     Family History  Problem Relation Age of Onset  . Heart attack Sister   . Arrhythmia Brother     s/p AICD  . Cardiomyopathy Brother   . Diabetes type II Brother     Review of Systems: Patient continues to have difficulty sleeping at night and often naps throughout the day. Otherwise, a 12 system review of systems was performed and was negative, except as noted in the history of present  illness.  --------------------------------------------------------------------------------------------------  Physical Exam: BP 118/64 (BP Location: Left Arm, Patient Position: Sitting, Cuff Size: Normal)   Pulse 66   Ht 5' 3"  (1.6 m)   Wt 62 kg (136 lb 12 oz)   BMI 24.22 kg/m   General:  Well-developed, well-nourished woman seated comfortably in the exam room. She is notably more upbeat today. She is accompanied by her daughter. HEENT: No conjunctival pallor or scleral icterus.  Moist mucous membranes.  OP clear. Neck: Supple without lymphadenopathy, thyromegaly. JVP approximately 8-10 cm with positive HJR. Lungs: Normal work of breathing.  Clear to auscultation bilaterally without wheezes or crackles. Heart: Regular rate and rhythm with 2/6 holosystolic murmur across the precordium. No rubs or gallops. Abd: Bowel sounds present.  Soft, NT/ND without hepatosplenomegaly Ext: 2+ pretibial edema bilaterally.  Radial, PT, and DP pulses are 2+ bilaterally. Skin: warm and dry without rash  EKG: Sinus rhythm with left axis deviation and left bundle branch block. No significant change from prior tracing on 06/25/16 (I have personally reviewed both tracings).  Lab Results  Component Value Date   WBC 4.0 07/15/2016   HGB 10.5 (L) 07/15/2016   HCT 31.9 (L) 07/15/2016   MCV 82.4 07/15/2016   PLT 218 07/15/2016    Lab Results  Component Value Date   NA 136 07/16/2016   K 4.6 07/16/2016  CL 100 07/16/2016   CO2 20 07/16/2016   BUN 21 07/16/2016   CREATININE 1.37 (H) 07/16/2016   GLUCOSE 149 (H) 07/16/2016   ALT 13 (L) 06/25/2016   --------------------------------------------------------------------------------------------------  ASSESSMENT AND PLAN: Severe systolic heart failure: Patient appears euvolemic and well compensated today with NYHA class II-III heart failure symptoms. She has lost another 8 pounds since her last visit and is likely near her dry weight today. I agree with  de-escalation of her diuretic regimen. We will go down to furosemide 40 mg daily. She should take an extra dose if she gains more than 2 pounds in a day or 5 pounds in a week. We will check a basic metabolic panel today to reevaluate her renal function and potassium. The patient should remain on her current doses of carvedilol, enalapril, hydralazine, isosorbide dinitrate, and spironolactone. I recommended that we proceed with left heart catheterization given her decline in LV function since July. However, the patient and her family asked that this be deferred. We will therefore continue with medical therapy and plan to repeat a limited echocardiogram in one month to reassess her LV function.  Coronary artery disease: No new symptoms to suggest worsening coronary insufficiency. As above, the patient and her family have declined cardiac catheterization at this time. We will continue with secondary prevention with low-dose aspirin. We will also check a lipid panel today and start atorvastatin 20 mg daily. We will plan to up titrate this in the future to 40-80 mg daily, as tolerated. The patient should follow-up with her PCP to discuss further management of her diabetes, as she would like to ultimately come off of insulin.  Hypertension: Blood pressure is much improved today and within the normal range. We will not make any changes to her current medication regimen.  Paroxysmal atrial fibrillation: Patient is in sinus rhythm today. Apixaban was discontinued after her last visit due to vaginal bleeding. I suspect her atrial fibrillation was likely postoperative. We will not restart anticoagulation at this time.  Depression: Mood and affect are significantly improved today. I encouraged the patient to continue with Lexapro and follow-up with her primary care provider.  Insomnia: Patient sleeps throughout much of the day but then is awake at night, consistent with sundowning. We discussed sleep hygiene at  length, including keeping the house right during the day and quiet and dark at night. Hopefully, improved sleep hygiene will allow the patient's sleep cycle to return to normal. I would advocate against using any sedatives or antipsychotics.  Follow-up: Return to clinic in 1 month with limited echo before visit.  Nelva Bush, MD 08/01/2016 12:22 PM

## 2016-08-02 ENCOUNTER — Telehealth: Payer: Self-pay | Admitting: Internal Medicine

## 2016-08-02 DIAGNOSIS — I10 Essential (primary) hypertension: Secondary | ICD-10-CM

## 2016-08-02 DIAGNOSIS — Z79899 Other long term (current) drug therapy: Secondary | ICD-10-CM

## 2016-08-02 DIAGNOSIS — Z5181 Encounter for therapeutic drug level monitoring: Secondary | ICD-10-CM

## 2016-08-02 DIAGNOSIS — I5021 Acute systolic (congestive) heart failure: Secondary | ICD-10-CM

## 2016-08-02 LAB — BASIC METABOLIC PANEL
BUN/Creatinine Ratio: 15 (ref 12–28)
BUN: 26 mg/dL (ref 8–27)
CALCIUM: 9.8 mg/dL (ref 8.7–10.3)
CHLORIDE: 94 mmol/L — AB (ref 96–106)
CO2: 23 mmol/L (ref 18–29)
Creatinine, Ser: 1.72 mg/dL — ABNORMAL HIGH (ref 0.57–1.00)
GFR calc Af Amer: 34 mL/min/{1.73_m2} — ABNORMAL LOW (ref >59)
GFR, EST NON AFRICAN AMERICAN: 29 mL/min/{1.73_m2} — AB (ref >59)
Glucose: 189 mg/dL — ABNORMAL HIGH (ref 65–99)
POTASSIUM: 5.3 mmol/L — AB (ref 3.5–5.2)
Sodium: 133 mmol/L — ABNORMAL LOW (ref 134–144)

## 2016-08-02 LAB — LIPID PANEL
CHOL/HDL RATIO: 5.4 ratio — AB (ref 0.0–4.4)
Cholesterol, Total: 135 mg/dL (ref 100–199)
HDL: 25 mg/dL — AB (ref >39)
LDL Calculated: 81 mg/dL (ref 0–99)
TRIGLYCERIDES: 147 mg/dL (ref 0–149)
VLDL Cholesterol Cal: 29 mg/dL (ref 5–40)

## 2016-08-02 NOTE — Telephone Encounter (Signed)
I spoke with [name]' daughter regarding results of her basic metabolic panel. This revealed mild worsening of renal function as well as mildly elevated potassium. These are likely due to intravascular volume depletion secondary to aggressive diuresis. We have already cut down furosemide yesterday. I encouraged the patient to liberalize her fluid intake (but not salt) over the next few days. We will also hold spironolactone until repeat labs are done. We will plan to repeat a BMP next week.

## 2016-08-04 NOTE — Telephone Encounter (Signed)
Left second message asking for them to come on Wednesday or Thursday for BMP.

## 2016-08-04 NOTE — Telephone Encounter (Signed)
Left detailed message, ok per DPR, for patient to return Thursday or Friday for repeat BMP [facility] and to call back if she had any questions.

## 2016-08-05 ENCOUNTER — Encounter: Payer: Self-pay | Admitting: Primary Care

## 2016-08-05 ENCOUNTER — Encounter: Payer: Self-pay | Admitting: Internal Medicine

## 2016-08-07 ENCOUNTER — Other Ambulatory Visit
Admission: RE | Admit: 2016-08-07 | Discharge: 2016-08-07 | Disposition: A | Discharge status: Home / Self Care | Payer: Medicare Other | Source: Ambulatory Visit | Attending: Internal Medicine | Admitting: Internal Medicine

## 2016-08-07 DIAGNOSIS — I10 Essential (primary) hypertension: Secondary | ICD-10-CM

## 2016-08-07 DIAGNOSIS — Z5181 Encounter for therapeutic drug level monitoring: Secondary | ICD-10-CM

## 2016-08-07 DIAGNOSIS — I5021 Acute systolic (congestive) heart failure: Secondary | ICD-10-CM | POA: Insufficient documentation

## 2016-08-07 DIAGNOSIS — I110 Hypertensive heart disease with heart failure: Secondary | ICD-10-CM | POA: Diagnosis not present

## 2016-08-07 DIAGNOSIS — Z79899 Other long term (current) drug therapy: Secondary | ICD-10-CM | POA: Diagnosis not present

## 2016-08-07 LAB — BASIC METABOLIC PANEL
ANION GAP: 6 (ref 5–15)
BUN: 21 mg/dL — AB (ref 6–20)
CHLORIDE: 103 mmol/L (ref 101–111)
CO2: 23 mmol/L (ref 22–32)
Calcium: 8.8 mg/dL — ABNORMAL LOW (ref 8.9–10.3)
Creatinine, Ser: 1.39 mg/dL — ABNORMAL HIGH (ref 0.44–1.00)
GFR calc Af Amer: 43 mL/min — ABNORMAL LOW (ref >60)
GFR calc non Af Amer: 37 mL/min — ABNORMAL LOW (ref >60)
GLUCOSE: 184 mg/dL — AB (ref 65–99)
POTASSIUM: 4.4 mmol/L (ref 3.5–5.1)
Sodium: 132 mmol/L — ABNORMAL LOW (ref 135–145)

## 2016-08-19 ENCOUNTER — Telehealth: Payer: Self-pay | Admitting: Internal Medicine

## 2016-08-19 ENCOUNTER — Other Ambulatory Visit: Payer: Self-pay

## 2016-08-19 ENCOUNTER — Ambulatory Visit (INDEPENDENT_AMBULATORY_CARE_PROVIDER_SITE_OTHER): Payer: Medicare Other

## 2016-08-19 DIAGNOSIS — I480 Paroxysmal atrial fibrillation: Secondary | ICD-10-CM | POA: Diagnosis not present

## 2016-08-19 DIAGNOSIS — I5022 Chronic systolic (congestive) heart failure: Secondary | ICD-10-CM | POA: Diagnosis not present

## 2016-08-19 NOTE — Telephone Encounter (Signed)
Patient daughter dropped off [employer] FML[employer] forms [employer] and $25 fee for [employer] .  Scanned to Epic, Faxed, and interoffice mailed to [employer].

## 2016-08-27 ENCOUNTER — Encounter: Payer: Self-pay | Admitting: Internal Medicine

## 2016-08-27 ENCOUNTER — Ambulatory Visit (INDEPENDENT_AMBULATORY_CARE_PROVIDER_SITE_OTHER): Payer: Medicare Other | Admitting: Internal Medicine

## 2016-08-27 VITALS — BP 146/70 | HR 62 | Ht 62.0 in | Wt 140.5 lb

## 2016-08-27 DIAGNOSIS — R2242 Localized swelling, mass and lump, left lower limb: Secondary | ICD-10-CM

## 2016-08-27 DIAGNOSIS — I5022 Chronic systolic (congestive) heart failure: Secondary | ICD-10-CM

## 2016-08-27 DIAGNOSIS — Z79899 Other long term (current) drug therapy: Secondary | ICD-10-CM | POA: Diagnosis not present

## 2016-08-27 DIAGNOSIS — I480 Paroxysmal atrial fibrillation: Secondary | ICD-10-CM

## 2016-08-27 DIAGNOSIS — I2510 Atherosclerotic heart disease of native coronary artery without angina pectoris: Secondary | ICD-10-CM | POA: Diagnosis not present

## 2016-08-27 DIAGNOSIS — I10 Essential (primary) hypertension: Secondary | ICD-10-CM

## 2016-08-27 NOTE — Progress Notes (Signed)
Follow-up Outpatient Visit Date: 08/27/2016  Chief Complaint: Follow-up heart failure  HPI:  Megan Howard is a 71 y.o. year-old female with history of coronary artery disease status post CABG in 12/2015 in [place], ischemic cardiomyopathy, postoperative atrial fibrillation, type 2 diabetes mellitus, and GERD, who presents for follow-up of leg swelling due to systolic heart failure. She underwent CABG in 12/2015; her postoperative course was complicated by rehospitalization and a lengthy stay in rehab.  She moved to [place] to be with her daughter this fall.  I first met her in late November, 2017, at which time she was markedly volume overloaded.  Echocardiogram performed after our first visit revealed biventricular dysfunction with LVEF of 30-35%, which was unchanged on recent follow-up echo.  Today, the patient reports feeling relatively well with stable mild dyspnea on exertion.  She is able to ambulate at home and at [facility] without any difficulty.  She denies leg edema, orthopnea, PND, palpitations, and chest pain.  Since her last visit, the patient and her daughter have noticed a lump along the left thigh.  They had not previously noticed this, though they believe it may have been obscured by her significant leg swelling.  Patient also notes occasional stomach pains worsened by eating.  She notes that she felt severe "heartburn" prior to her initial cardiac event leading to CABG this summer.  She has not had any similar pain recently.  Patient's weight has been increasing slightly despite Megan Howard not having a good appetite.  She is about 2-3 pounds above her dry weight.  She is currently on furosemide 40 mg daily, though she has taken an extra dose on a couple of occasions over the last week due to increasing weight.  --------------------------------------------------------------------------------------------------  Cardiovascular History & Procedures: Cardiovascular Problems:  Coronary artery  disease  Ischemic cardiomyopathy  Postoperative atrial fibrillation  Risk Factors:  Known coronary artery disease, diabetes mellitus, family history, and age greater than 89  Cath/PCI:  LHC (12/29/15, [facility]): LMCA with mild luminal irregularities. LAD with mild proximal disease and 99% apical stenosis. Codominant LCx with 80% mid vessel stenosis. Small, codominant RCA with 99% mid and 75-80% distal stenoses. Unsuccessful PCI to mid RCA due to inability to expand stenosis and deployed stent. 80 and 90% residual stenosis remains. LVEDP 21 mmHg. LVEF 45% with inferior hypokinesis.  CV Surgery:  CABG (12/2015 in [place]): SVG to OM2 and SVG to RCA.  EP Procedures and Devices:  Holter monitor (2017): Reportedly no evidence of atrial fibrillation.  Non-Invasive Evaluation(s):  Limited TTE (08/19/16): Normal LV size with mild LVH.  LVEF 30-35%.  MAC with mild MR present.  LA moderately dilated.  RA mildly dilated.  Mild TR.  Mild-to moderate pulmonary hypertension (RVSP 42 mmHg).  TTE (07/03/16): Mildly dilated LV with severely reduced contraction (EF 30-35%). There is diffuse hypokinesis with severe hypokinesis of the anterior and anteroseptal walls. 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 There is mild left atrial enlargement. RV size is moderately enlarged with moderately reduced function. There is moderate TR. Moderate pulmonary hypertension also evident.  TTE (04/20/16): Mild LVH with LVEF 40% and moderate global hypokinesis. Restrictive filling pattern noted. Decreased RV contraction with evidence of RV volume overload. Aortic sclerosis. Mild mitral and moderate tricuspid regurgitation. Moderate to severe pulmonary hypertension.  TTE (02/13/16): Normal LV size and function with LVEF of 67%. Mild LVH present. 2+ tricuspid and 1+ pulmonic regurgitation present. Mild pulmonary hypertension.  Recent CV Pertinent Labs: Lab Results  Component Value Date   CHOL 135  08/01/2016   HDL 25 (L) 08/01/2016   LDLCALC 81 08/01/2016   TRIG 147 08/01/2016   CHOLHDL 5.4 (H) 08/01/2016   INR WILL FOLLOW 07/08/2016   BNP >4,500.0 (H) 06/25/2016   K 4.4 08/07/2016   BUN 21 (H) 08/07/2016   BUN 26 08/01/2016   CREATININE 1.39 (H) 08/07/2016    Past medical and surgical history were reviewed and updated in EPIC.   Outpatient Encounter Prescriptions as of 08/27/2016  Medication Sig  . aspirin EC 81 MG tablet Take 81 mg by mouth daily.  Marland Kitchen atorvastatin (LIPITOR) 20 MG tablet Take 1 tablet (20 mg total) by mouth daily.  . carvedilol (COREG) 25 MG tablet Take 12.5 mg by mouth 2 (two) times daily with a meal.   . enalapril (VASOTEC) 10 MG tablet Take 2 tablets (20 mg total) by mouth 2 (two) times daily.  Marland Kitchen escitalopram (LEXAPRO) 20 MG tablet Take 1 tablet (20 mg total) by mouth daily.  . furosemide (LASIX) 40 MG tablet Take 1 tablet (40 mg total) by mouth daily. If wt >2lbs in 24 hours or >5lbs in 1 week, take 40 mg twice a day until returns to baseline wt.  . hydrALAZINE (APRESOLINE) 25 MG tablet Take 1 tablet (25 mg total) by mouth 2 (two) times daily.  . insulin glargine (LANTUS) 100 UNIT/ML injection Inject 10 Units into the skin at bedtime.  . isosorbide dinitrate (ISORDIL) 20 MG tablet Take 1 tablet (20 mg total) by mouth 3 (three) times daily.  Marland Kitchen latanoprost (XALATAN) 0.005 % ophthalmic solution Place 1 drop into both eyes at bedtime.  . Magnesium Oxide 400 MG CAPS Take 1 capsule (400 mg total) by mouth daily.  . pantoprazole (PROTONIX) 40 MG tablet Take 1 tablet (40 mg total) by mouth daily.  Marland Kitchen spironolactone (ALDACTONE) 25 MG tablet Take 1 tablet (25 mg total) by mouth daily.   No facility-administered encounter medications on file as of 08/27/2016.     Allergies: Shellfish allergy  Social History   Social History  . Marital status: Single    Spouse name: N/A  . Number of children: N/A  . Years of education: N/A   Occupational History  . Not on  file.   Social History Main Topics  . Smoking status: Never Smoker  . Smokeless tobacco: Never Used  . Alcohol use No  . Drug use: No  . Sexual activity: Not on file   Other Topics Concern  . Not on file   Social History Narrative   Single.   Moved from [place].   Now lives with daughter.     Family History  Problem Relation Age of Onset  . Heart attack Sister   . Arrhythmia Brother     s/p AICD  . Cardiomyopathy Brother   . Diabetes type II Brother     Review of Systems: A 12-system review of systems was negative except as noted in the HPI.  --------------------------------------------------------------------------------------------------  Physical Exam: BP (!) 146/70 (BP Location: Left Arm, Patient Position: Sitting, Cuff Size: Normal)   Pulse 62   Ht 5' 2" (1.575 m)   Wt 140 lb 8 oz (63.7 kg)   BMI 25.70 kg/m   General:  Well-developed, well-nourished woman seated comfortably in the exam room. She is notably more upbeat today. She is accompanied by her daughter. HEENT: No conjunctival pallor or scleral icterus.  Moist mucous membranes.  OP clear. Neck: Supple without lymphadenopathy, thyromegaly. JVP  approximately 8-10 cm with positive HJR. Lungs: Normal work of breathing.  Faint left basilar crackles are noted. Heart: Regular rate and rhythm with 2/6 holosystolic murmur across the precordium. No rubs or gallops. Abd: Bowel sounds present.  Soft, NT/ND without hepatosplenomegaly Ext: 2+ pretibial edema bilaterally.  Radial, PT, and DP pulses are 2+ bilaterally. Firm, non-tender, non-pulsatile mass is located along the left medial thigh just above the knee. No overlying skin changes.  Mass is about the size of a golf ball. Skin: warm and dry without rash   Lab Results  Component Value Date   WBC 4.0 07/15/2016   HGB 10.5 (L) 07/15/2016   HCT 31.9 (L) 07/15/2016   MCV 82.4 07/15/2016   PLT 218 07/15/2016    Lab Results  Component Value Date   NA 132 (L)  08/07/2016   K 4.4 08/07/2016   CL 103 08/07/2016   CO2 23 08/07/2016   BUN 21 (H) 08/07/2016   CREATININE 1.39 (H) 08/07/2016   GLUCOSE 184 (H) 08/07/2016   ALT 13 (L) 06/25/2016   --------------------------------------------------------------------------------------------------  ASSESSMENT AND PLAN: Severe chronic systolic heart failure: Patient is symptomatically stable with NYHA class II-III heart failure symptoms.  Her weight is up slightly and her lung exam is notable for faint left basilar crackles.  We have agreed to increase furosemide to 40 mg daily until she has returned back to her dry weight (~134 pounds), at which time she can return back to 40 mg daily. We'll check a BMP to reevaluate her renal function and potassium. LVEF not significantly improved on repeat echo last week.  We discussed ischemia evaluation but will defer this pending evaluation of the left thigh mass.  Coronary artery disease: No new symptoms to suggest worsening coronary insufficiency. We'll continue with secondary prevention, including ASA, carvedilol, and atorvastatin.  We spoke again at length regarding the risks and benefits of heart catheterization and have agreed to wait until we have optimized her volume status and the newly discovered left thigh mass has been further evaluated.  Hypertension: Blood pressure mildly elevated today.  We will increase furosemide, as above, and continue the remainder of her medications.  Paroxysmal atrial fibrillation: Patient is in sinus rhythm today. The patient was previously on apixaban, though this was discontinued after outside Holter showed no evidence of a-fib and the patient developed vaginal bleeding.  Left thigh mass: New since our last visit, though patient and daughter believe that edema may have previously obscured the mass.  It is non-tender and not pulsatile.  It is in the region of where SVG harvesting was performed, though it would be unusual for a  hematoma to develop this far out.  I have advised the patient to see her PCP as soon as possible for further evaluation to include imaging to better characterize this mass.  It should be worked up prior to [place] proceeding with cardiac catheterization.  Follow-up: Return to clinic in 4 weeks.  Nelva Bush, MD 08/28/2016 4:52 AM

## 2016-08-27 NOTE — Patient Instructions (Signed)
Medication Instructions:  Your physician has recommended you make the following change in your medication:  1- TAKE Lasix 40 mg by mouth twice a day until you return to your normal dry weight, THEN take Lasix 40 mg once a day.   Labwork: Your physician recommends that you return for lab work in: TODAY (BMP).   Testing/Procedures: none  Follow-Up: Your physician recommends that you schedule a follow-up appointment in: 4 WEEKS WITH DR END.  - Dr End would like you to please call your Primary Care Physician for evaluation of area on your thigh.   If you need a refill on your cardiac medications before your next appointment, please call your pharmacy.

## 2016-08-28 ENCOUNTER — Encounter: Payer: Self-pay | Admitting: Internal Medicine

## 2016-08-28 DIAGNOSIS — I5022 Chronic systolic (congestive) heart failure: Secondary | ICD-10-CM | POA: Insufficient documentation

## 2016-08-28 DIAGNOSIS — R2242 Localized swelling, mass and lump, left lower limb: Secondary | ICD-10-CM | POA: Insufficient documentation

## 2016-08-28 LAB — BASIC METABOLIC PANEL
BUN / CREAT RATIO: 14 (ref 12–28)
BUN: 17 mg/dL (ref 8–27)
CHLORIDE: 97 mmol/L (ref 96–106)
CO2: 22 mmol/L (ref 18–29)
Calcium: 9.5 mg/dL (ref 8.7–10.3)
Creatinine, Ser: 1.2 mg/dL — ABNORMAL HIGH (ref 0.57–1.00)
GFR calc non Af Amer: 45 mL/min/{1.73_m2} — ABNORMAL LOW (ref >59)
GFR, EST AFRICAN AMERICAN: 52 mL/min/{1.73_m2} — AB (ref >59)
Glucose: 204 mg/dL — ABNORMAL HIGH (ref 65–99)
POTASSIUM: 4 mmol/L (ref 3.5–5.2)
Sodium: 137 mmol/L (ref 134–144)

## 2016-08-29 ENCOUNTER — Encounter: Payer: Self-pay | Admitting: Primary Care

## 2016-08-29 ENCOUNTER — Ambulatory Visit (INDEPENDENT_AMBULATORY_CARE_PROVIDER_SITE_OTHER): Payer: Medicare Other | Admitting: Primary Care

## 2016-08-29 VITALS — BP 148/76 | HR 58 | Temp 97.7°F | Ht 62.0 in | Wt 139.8 lb

## 2016-08-29 DIAGNOSIS — F418 Other specified anxiety disorders: Secondary | ICD-10-CM

## 2016-08-29 DIAGNOSIS — I10 Essential (primary) hypertension: Secondary | ICD-10-CM | POA: Diagnosis not present

## 2016-08-29 DIAGNOSIS — F32A Depression, unspecified: Secondary | ICD-10-CM

## 2016-08-29 DIAGNOSIS — R2242 Localized swelling, mass and lump, left lower limb: Secondary | ICD-10-CM

## 2016-08-29 DIAGNOSIS — Z794 Long term (current) use of insulin: Secondary | ICD-10-CM | POA: Diagnosis not present

## 2016-08-29 DIAGNOSIS — I480 Paroxysmal atrial fibrillation: Secondary | ICD-10-CM

## 2016-08-29 DIAGNOSIS — E119 Type 2 diabetes mellitus without complications: Secondary | ICD-10-CM | POA: Diagnosis not present

## 2016-08-29 DIAGNOSIS — I5022 Chronic systolic (congestive) heart failure: Secondary | ICD-10-CM

## 2016-08-29 DIAGNOSIS — F419 Anxiety disorder, unspecified: Secondary | ICD-10-CM

## 2016-08-29 DIAGNOSIS — I2510 Atherosclerotic heart disease of native coronary artery without angina pectoris: Secondary | ICD-10-CM

## 2016-08-29 DIAGNOSIS — G4700 Insomnia, unspecified: Secondary | ICD-10-CM

## 2016-08-29 DIAGNOSIS — F329 Major depressive disorder, single episode, unspecified: Secondary | ICD-10-CM

## 2016-08-29 DIAGNOSIS — Z7409 Other reduced mobility: Secondary | ICD-10-CM

## 2016-08-29 LAB — HEMOGLOBIN A1C: Hgb A1c MFr Bld: 6.5 % (ref 4.6–6.5)

## 2016-08-29 MED ORDER — CARVEDILOL 12.5 MG PO TABS: 12.5000 mg | ORAL_TABLET | Freq: Two times a day (BID) | ORAL | 3 refills | Status: AC

## 2016-08-29 NOTE — Assessment & Plan Note (Signed)
Significantly improved since last visit. Will continue to monitor.

## 2016-08-29 NOTE — Assessment & Plan Note (Signed)
Located to left medial thigh proximal to knee. US ordered for further evaluation of mass.

## 2016-08-29 NOTE — Assessment & Plan Note (Signed)
Weight loss of nearly 30 pounds since last visit. Significant improvement in mobility. Following with cardiology. Continue diuretics, beta blocker, ACE.

## 2016-08-29 NOTE — Assessment & Plan Note (Signed)
Check A1C today, if stable then will replace insulin with oral meds. Will check records for prior medications. Discussed importance of healthy diet.

## 2016-08-29 NOTE — Progress Notes (Signed)
Pre visit review using our clinic review tool, if applicable. No additional management support is needed unless otherwise documented below in the visit note. 

## 2016-08-29 NOTE — Patient Instructions (Signed)
I sent refills for carvedilol 12.5 mg tablets to your pharmacy. Take 1 tablet by mouth twice daily.  Stop by the front desk and [facility].  Complete lab work prior to leaving today. I will notify you of your results once received and we can discuss going on oral medications for diabetes.   Follow up in 6 months for re-evaluation or sooner if needed.  It was a pleasure to see you today! You look great!

## 2016-08-29 NOTE — Assessment & Plan Note (Signed)
Significant improvement in mood after increase in Lexapro to 20 mg. Suspect this is also due to improvement in quality of life secondary to fluid weight loss. Appreciate cardiology's expertise.

## 2016-08-29 NOTE — Assessment & Plan Note (Signed)
Improved with increase in Lexapro, continue to monitor.

## 2016-08-29 NOTE — Assessment & Plan Note (Signed)
Regular rate and rhythm today 

## 2016-08-29 NOTE — Progress Notes (Signed)
Subjective:    Patient ID: Megan Howard, female    DOB: 09/01/1945, 71 y.o.   MRN: [id number]  HPI  [name] is a 71 year old female who presents today for follow up and a chief complaint of lower extremity mass.  1) Lower Extremity Mass: Located to her left lateral lower extremity that has first noticed 2 weeks ago. She denies pain and tenderness. This was evaluated by her cardiologist who doesn't believe it to be related to the SVG harvesting that was preformed in June 2017. She has lost nearly 30 pounds of fluid, most of which being in her lower extremities, so she's unsure if this mass has been present longer. Her cardiologist would like to proceed with cardiac catheterization, but given this new finding she must undergo evaluation prior to catheterization procedure.  2) CHF: NYHA class II-III. Weight loss of nearly 30 pounds since her initial visit in November 2017. She is following with cardiology and is managed on furosemide 40 mg daily, with an extra dose on occasion for increase in weight, isosorbide dinitrate, and spironlactone. She is also managed on carvedilol 12.5 mg BID for which her daughter cuts in half. Her daughter is requesting the 12.5 mg dose so she won't have to break these in half. Her weight is up from baseline dry weight so her furesomide has been increased temporarily.   Wt Readings from Last 3 Encounters:  08/29/16 139 lb 12.8 oz (63.4 kg)  08/27/16 140 lb 8 oz (63.7 kg)  08/01/16 136 lb 12 oz (62 kg)   3) Type 2 Diabetes: Currently managed on Lantus 10 units at bedtime. Last A1C of 6.8 in November 2017 which was an improvement from 8.8 in July 2017. Her family would like to stop Lantus as they are the once administering, and put her back on the oral tablets she was once taking. They are unsure of the medication she was previously taking. She denies dizziness, visual changes. Her appetite is okay overall. Her daughter has removed a lot of the junk food from the house  so she's no longer binging.   4) Anxiety and Depression: Long history of. Currently managed on Lexapro 20 mg that was increased during her last visit from her 10 mg dose. Since the dose increase she's doing much better. She's no longer calling out at night, she's able to sleep through the night, and her overall mood has drastically improved. She denies SI/HI. She's not yet connected with the therapist that was recommended during her visit in November. She doesn't think she'll pursue the therapist as she's experienced improvement.   Review of Systems  Constitutional: Negative for fatigue.  Eyes: Negative for visual disturbance.  Respiratory: Negative for shortness of breath.   Cardiovascular: Negative for chest pain.  Neurological: Negative for dizziness and numbness.  Psychiatric/Behavioral: Negative for behavioral problems and sleep disturbance. The patient is not nervous/anxious.        Anxiety much improved       Past Medical History:  Diagnosis Date  . Anemia   . Cardiomyopathy, ischemic   . Coronary artery disease   . Diabetes mellitus without complication (HCC)   . Dyslipidemia   . Hypertension      Social History   Social History  . Marital status: Single    Spouse name: N/A  . Number of children: N/A  . Years of education: N/A   Occupational History  . Not on file.   Social History Main Topics  .  Smoking status: Never Smoker  . Smokeless tobacco: Never Used  . Alcohol use No  . Drug use: No  . Sexual activity: Not on file   Other Topics Concern  . Not on file   Social History Narrative   Single.   Moved from [place].   Now lives with daughter.     Past Surgical History:  Procedure Laterality Date  . CARDIAC CATHETERIZATION    . CORONARY ANGIOPLASTY    . CORONARY ARTERY BYPASS GRAFT  12/2015   CABG x 2    Family History  Problem Relation Age of Onset  . Heart attack Sister   . Arrhythmia Brother     s/p AICD  . Cardiomyopathy Brother   .  Diabetes type II Brother     Allergies  Allergen Reactions  . Shellfish Allergy     Face swelling    Current Outpatient Prescriptions on File Prior to Visit  Medication Sig Dispense Refill  . aspirin EC 81 MG tablet Take 81 mg by mouth daily.    Marland Kitchen atorvastatin (LIPITOR) 20 MG tablet Take 1 tablet (20 mg total) by mouth daily. 90 tablet 3  . enalapril (VASOTEC) 10 MG tablet Take 2 tablets (20 mg total) by mouth 2 (two) times daily. 60 tablet 0  . escitalopram (LEXAPRO) 20 MG tablet Take 1 tablet (20 mg total) by mouth daily. 90 tablet 0  . furosemide (LASIX) 40 MG tablet Take 1 tablet (40 mg total) by mouth daily. If wt >2lbs in 24 hours or >5lbs in 1 week, take 40 mg twice a day until returns to baseline wt. 90 tablet 3  . hydrALAZINE (APRESOLINE) 25 MG tablet Take 1 tablet (25 mg total) by mouth 2 (two) times daily. 180 tablet 1  . insulin glargine (LANTUS) 100 UNIT/ML injection Inject 10 Units into the skin at bedtime.    . isosorbide dinitrate (ISORDIL) 20 MG tablet Take 1 tablet (20 mg total) by mouth 3 (three) times daily. 60 tablet 5  . latanoprost (XALATAN) 0.005 % ophthalmic solution Place 1 drop into both eyes at bedtime.    . Magnesium Oxide 400 MG CAPS Take 1 capsule (400 mg total) by mouth daily. 90 capsule 1  . pantoprazole (PROTONIX) 40 MG tablet Take 1 tablet (40 mg total) by mouth daily. 90 tablet 1  . spironolactone (ALDACTONE) 25 MG tablet Take 1 tablet (25 mg total) by mouth daily. 90 tablet 1   No current facility-administered medications on file prior to visit.     BP (!) 148/76   Pulse (!) 58   Temp 97.7 F (36.5 C) (Oral)   Ht 5\' 2"  (1.575 m)   Wt 139 lb 12.8 oz (63.4 kg)   SpO2 97%   BMI 25.57 kg/m    Objective:   Physical Exam  Constitutional: She appears well-nourished.  Neck: Neck supple.  Cardiovascular: Normal rate and regular rhythm.   Significant improvement in lower extremity edema since last visit.  Pulmonary/Chest: Effort normal and breath  sounds normal.  Skin: Skin is warm and dry.  Moderately sized mass to left medial lower extremity proximal to knee. Non tender, no erythema, non pulsatile.   Psychiatric: She has a normal mood and affect.  Smiling and laughing today          Assessment & Plan:

## 2016-08-30 ENCOUNTER — Encounter: Payer: Self-pay | Admitting: Primary Care

## 2016-08-31 ENCOUNTER — Other Ambulatory Visit: Payer: Self-pay | Admitting: Primary Care

## 2016-08-31 DIAGNOSIS — Z794 Long term (current) use of insulin: Principal | ICD-10-CM

## 2016-08-31 DIAGNOSIS — E119 Type 2 diabetes mellitus without complications: Secondary | ICD-10-CM

## 2016-08-31 MED ORDER — GLIPIZIDE 5 MG PO TABS: 5.0000 mg | ORAL_TABLET | Freq: Two times a day (BID) | ORAL | 1 refills | Status: DC

## 2016-09-01 ENCOUNTER — Ambulatory Visit
Admission: RE | Admit: 2016-09-01 | Discharge: 2016-09-01 | Disposition: A | Discharge status: Home / Self Care | Payer: Medicare Other | Source: Ambulatory Visit | Attending: Primary Care | Admitting: Primary Care

## 2016-09-01 DIAGNOSIS — R2242 Localized swelling, mass and lump, left lower limb: Secondary | ICD-10-CM

## 2016-09-10 ENCOUNTER — Other Ambulatory Visit: Payer: Self-pay | Admitting: Primary Care

## 2016-09-10 DIAGNOSIS — F419 Anxiety disorder, unspecified: Principal | ICD-10-CM

## 2016-09-10 DIAGNOSIS — F329 Major depressive disorder, single episode, unspecified: Secondary | ICD-10-CM

## 2016-09-10 MED ORDER — ESCITALOPRAM OXALATE 20 MG PO TABS: 20.0000 mg | ORAL_TABLET | Freq: Every day | ORAL | 1 refills | Status: DC

## 2016-09-10 NOTE — Telephone Encounter (Signed)
Received faxed refill request for escitalopram (LEXAPRO) 20 MG tablet. Last prescribed on 07/16/2016. Last seen on 08/29/2016.

## 2016-09-24 ENCOUNTER — Ambulatory Visit (INDEPENDENT_AMBULATORY_CARE_PROVIDER_SITE_OTHER): Payer: Medicare Other | Admitting: Internal Medicine

## 2016-09-24 ENCOUNTER — Encounter: Payer: Self-pay | Admitting: Internal Medicine

## 2016-09-24 VITALS — BP 132/62 | HR 56 | Ht 62.0 in | Wt 140.5 lb

## 2016-09-24 DIAGNOSIS — R2242 Localized swelling, mass and lump, left lower limb: Secondary | ICD-10-CM

## 2016-09-24 DIAGNOSIS — I5022 Chronic systolic (congestive) heart failure: Secondary | ICD-10-CM | POA: Diagnosis not present

## 2016-09-24 DIAGNOSIS — I2510 Atherosclerotic heart disease of native coronary artery without angina pectoris: Secondary | ICD-10-CM

## 2016-09-24 DIAGNOSIS — I10 Essential (primary) hypertension: Secondary | ICD-10-CM | POA: Diagnosis not present

## 2016-09-24 NOTE — Progress Notes (Signed)
Follow-up Outpatient Visit Date: 09/24/2016  Chief Complaint: Follow-up heart failure  HPI:  Megan Howard is a 71 y.o. year-old female with history of coronary artery disease status post CABG in 12/2015 in [place], ischemic cardiomyopathy, postoperative atrial fibrillation, type 2 diabetes mellitus, and GERD, who presents for follow-up of leg swelling due to systolic heart failure. She underwent CABG in 12/2015; her postoperative course was complicated by rehospitalization and a lengthy stay in rehab.  She moved to [place] to be with her daughter this fall.  I first met her in late November, 2017, at which time she was markedly volume overloaded.  Echocardiogram performed after our first visit revealed biventricular dysfunction with LVEF of 30-35%, which was unchanged on recent follow-up echo.  The patient feels well today with the exception of nocturnal itching over the last few days. She inquires about using Benadryl. She has not had chest pain or shortness of breath. Edema has resolved. Weight has been stable around 137 pounds. She increased furosemide to 40 mg 3 times a day for about a week, but did not have any change in her weight. She is back to taking 40 mg twice a day. She is hoping to travel back home to [place] in the next few weeks.  Previously noted left medial thigh mass was evaluated by her PCP. Ultrasound showed a complex cystic lesion. Patient and her family report that it has remained stable in size. It is not painful.  --------------------------------------------------------------------------------------------------  Cardiovascular History & Procedures: Cardiovascular Problems:  Coronary artery disease  Ischemic cardiomyopathy  Postoperative atrial fibrillation  Risk Factors:  Known coronary artery disease, diabetes mellitus, family history, and age greater than 78  Cath/PCI:  LHC (12/29/15, [facility]): LMCA with mild luminal irregularities. LAD with mild  proximal disease and 99% apical stenosis. Codominant LCx with 80% mid vessel stenosis. Small, codominant RCA with 99% mid and 75-80% distal stenoses. Unsuccessful PCI to mid RCA due to inability to expand stenosis and deployed stent. 80 and 90% residual stenosis remains. LVEDP 21 mmHg. LVEF 45% with inferior hypokinesis.  CV Surgery:  CABG (12/2015 in [place]): SVG to OM2 and SVG to RCA.  EP Procedures and Devices:  Holter monitor (2017): Reportedly no evidence of atrial fibrillation.  Non-Invasive Evaluation(s):  Limited TTE (08/19/16): Normal LV size with mild LVH.  LVEF 30-35%.  MAC with mild MR present.  LA moderately dilated.  RA mildly dilated.  Mild TR.  Mild-to moderate pulmonary hypertension (RVSP 42 mmHg).  TTE (07/03/16): Mildly dilated LV with severely reduced contraction (EF 30-35%). There is diffuse hypokinesis with severe hypokinesis of the anterior and anteroseptal walls. 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 There is mild left atrial enlargement. RV size is moderately enlarged with moderately reduced function. There is moderate TR. Moderate pulmonary hypertension also evident.  TTE (04/20/16): Mild LVH with LVEF 40% and moderate global hypokinesis. Restrictive filling pattern noted. Decreased RV contraction with evidence of RV volume overload. Aortic sclerosis. Mild mitral and moderate tricuspid regurgitation. Moderate to severe pulmonary hypertension.  TTE (02/13/16): Normal LV size and function with LVEF of 67%. Mild LVH present. 2+ tricuspid and 1+ pulmonic regurgitation present. Mild pulmonary hypertension.  Recent CV Pertinent Labs: Lab Results  Component Value Date   CHOL 135 08/01/2016   HDL 25 (L) 08/01/2016   LDLCALC 81 08/01/2016   TRIG 147 08/01/2016   CHOLHDL 5.4 (H) 08/01/2016   INR WILL FOLLOW 07/08/2016   BNP >4,500.0 (H) 06/25/2016   K 4.0  08/27/2016   BUN 17 08/27/2016   CREATININE 1.20 (H) 08/27/2016    Past medical and surgical history  were reviewed and updated in EPIC.   Outpatient Encounter Prescriptions as of 09/24/2016  Medication Sig  . aspirin EC 81 MG tablet Take 81 mg by mouth daily.  Marland Kitchen atorvastatin (LIPITOR) 20 MG tablet Take 1 tablet (20 mg total) by mouth daily.  . carvedilol (COREG) 12.5 MG tablet Take 1 tablet (12.5 mg total) by mouth 2 (two) times daily with a meal.  . enalapril (VASOTEC) 10 MG tablet Take 2 tablets (20 mg total) by mouth 2 (two) times daily.  Marland Kitchen escitalopram (LEXAPRO) 20 MG tablet Take 1 tablet (20 mg total) by mouth daily.  . furosemide (LASIX) 40 MG tablet Take 1 tablet (40 mg total) by mouth daily. If wt >2lbs in 24 hours or >5lbs in 1 week, take 40 mg twice a day until returns to baseline wt.  . glipiZIDE (GLUCOTROL) 5 MG tablet Take 1 tablet (5 mg total) by mouth 2 (two) times daily before a meal. For diabetes  . hydrALAZINE (APRESOLINE) 25 MG tablet Take 1 tablet (25 mg total) by mouth 2 (two) times daily.  . insulin glargine (LANTUS) 100 UNIT/ML injection Inject 10 Units into the skin at bedtime.  . isosorbide dinitrate (ISORDIL) 20 MG tablet Take 1 tablet (20 mg total) by mouth 3 (three) times daily.  Marland Kitchen latanoprost (XALATAN) 0.005 % ophthalmic solution Place 1 drop into both eyes at bedtime.  . Magnesium Oxide 400 MG CAPS Take 1 capsule (400 mg total) by mouth daily.  . pantoprazole (PROTONIX) 40 MG tablet Take 1 tablet (40 mg total) by mouth daily.  Marland Kitchen spironolactone (ALDACTONE) 25 MG tablet Take 1 tablet (25 mg total) by mouth daily.   No facility-administered encounter medications on file as of 09/24/2016.     Allergies: Shellfish allergy  Social History   Social History  . Marital status: Single    Spouse name: N/A  . Number of children: N/A  . Years of education: N/A   Occupational History  . Not on file.   Social History Main Topics  . Smoking status: Never Smoker  . Smokeless tobacco: Never Used  . Alcohol use No  . Drug use: No  . Sexual activity: Not on file    Other Topics Concern  . Not on file   Social History Narrative   Single.   Moved from [place].   Now lives with daughter.     Family History  Problem Relation Age of Onset  . Heart attack Sister   . Arrhythmia Brother     s/p AICD  . Cardiomyopathy Brother   . Diabetes type II Brother     Review of Systems: A 12-system review of systems was negative except as noted in the HPI.  --------------------------------------------------------------------------------------------------  Physical Exam: BP 132/62 (BP Location: Left Arm, Patient Position: Sitting, Cuff Size: Normal)   Pulse (!) 56   Ht _0  (1.575 m)   Wt 140 lb 8 oz (63.7 kg)   BMI 25.70 kg/m   General:  Well-developed, well-nourished woman seated comfortably in the exam room. She is accompanied by her daughter. HEENT: No conjunctival pallor or scleral icterus.  Moist mucous membranes.  OP clear. Neck: Supple without lymphadenopathy, thyromegaly. No JVD or HJR.. Lungs: Normal work of breathing. Lungs are clear bilaterally without wheezes or crackles. Heart: Regular rate and rhythm with 2/6 holosystolic murmur across the precordium. No rubs or gallops.  Abd: Bowel sounds present.  Soft, NT/ND without hepatosplenomegaly Ext: No significant lower extremity edema.  Radial, PT, and DP pulses are 2+ bilaterally. Skin: warm and dry without rash  EKG: Normal sinus rhythm with left bundle branch block. No significant change from prior tracing on 08/01/16 (I have personally reviewed both tracings).  Lab Results  Component Value Date   WBC 4.0 07/15/2016   HGB 10.5 (L) 07/15/2016   HCT 31.9 (L) 07/15/2016   MCV 82.4 07/15/2016   PLT 218 07/15/2016    Lab Results  Component Value Date   NA 137 08/27/2016   K 4.0 08/27/2016   CL 97 08/27/2016   CO2 22 08/27/2016   BUN 17 08/27/2016   CREATININE 1.20 (H) 08/27/2016   GLUCOSE 204 (H) 08/27/2016   ALT 13 (L) 06/25/2016    --------------------------------------------------------------------------------------------------  ASSESSMENT AND PLAN: Severe chronic systolic heart failure: Ms. Cortner reports feeling well and appears to be in good spirits today. She is euvolemic and well compensated with NYHA class II heart failure symptoms. We will continue her current medication regimen of carvedilol, enalapril, and spironolactone. She did not tolerate higher doses of beta blocker due to fatigue. We had a lengthy discussion regarding management of chronic systolic heart failure, including ICD placement and possible cardiac resynchronization therapy, given her severely reduced EF and left bundle branch block. I would prefer to perform ischemia evaluation before referral for BiV/ICD/ to ensure that a reversible cause of her progressively worsening LVEF since CABG is not present. The patient and her daughter are very hesitant to proceed with any invasive procedure, despite risk for worsening heart failure and adverse outcomes.  Coronary artery disease: No new symptoms to suggest worsening coronary insufficiency. We will not make any medication changes at this time. As above, I think it would be beneficial to exclude graft failure or progression of native CAD since her CABG, given that LVEF has declined since the summer. Patient and her family are white hesitant to proceed with this at the current time. I brought the possibility of myocardial perfusion stress test as an alternative, albeit less sensitive and specific. They would like to defer that as well.  Hypertension: Blood pressure is well controlled today. We will not make any medication changes at this time.  Left thigh mass: Patient reports that this is stable. Recent ultrasound showed a complex cystic mass. Question if this was related to vein harvesting time of CABG. I advised the patient and her family to continue to monitor the area and to speak with Mrs. Clark any  enlargement is noted.  Follow-up: Return to clinic when the patient returns from [place] in early April. We will readdress cardiac catheterization at that time. I advised the patient and her family that Ms. Gillingham is at increased risk for cardiovascular events given her cardiomyopathy and CAD, including when traveling. She is on good doses of evidence-based heart failure medications. As she does not wish to proceed with cardiac catheterization at this time, I believe that she is optimized from a CAD and heart failure standpoint.  I spent 30 minutes with the patient and her family, including greater than 50% of the time counseling the patient regarding chronic heart failure management, cardiac catheterization procedure, and ICD/cardiac resynchronization therapy.   Megan Bush, MD 09/25/2016 4:45 PM

## 2016-09-24 NOTE — Patient Instructions (Signed)
Medication Instructions:  Your physician recommends that you continue on your current medications as directed. Please refer to the Current Medication list given to you today.   Labwork: none  Testing/Procedures: none  Follow-Up: Your physician recommends that you schedule a follow-up appointment in: EARLY [facility] WITH DR END.   If you need a refill on your cardiac medications before your next appointment, please call your pharmacy.

## 2016-09-25 ENCOUNTER — Encounter: Payer: Self-pay | Admitting: Internal Medicine

## 2016-09-29 ENCOUNTER — Ambulatory Visit: Payer: Medicare Other

## 2016-10-06 ENCOUNTER — Ambulatory Visit (INDEPENDENT_AMBULATORY_CARE_PROVIDER_SITE_OTHER): Payer: Medicare Other | Admitting: Podiatry

## 2016-10-06 ENCOUNTER — Encounter: Payer: Self-pay | Admitting: Podiatry

## 2016-10-06 DIAGNOSIS — I2510 Atherosclerotic heart disease of native coronary artery without angina pectoris: Secondary | ICD-10-CM

## 2016-10-06 DIAGNOSIS — B351 Tinea unguium: Secondary | ICD-10-CM | POA: Diagnosis not present

## 2016-10-06 DIAGNOSIS — L608 Other nail disorders: Secondary | ICD-10-CM

## 2016-10-06 DIAGNOSIS — M79676 Pain in unspecified toe(s): Secondary | ICD-10-CM

## 2016-10-06 NOTE — Progress Notes (Signed)
   Subjective:    Patient ID: Megan Howard, female    DOB: 08/16/1945, 71 y.o.   MRN: [id number]  HPI This patient presents to the office with chief complaint of painful long thick nails.  She says the nails are painful walking and wearing her shoes.  Patient is diabetic.  She presents for preventive foot care services.    Review of Systems     Objective:   Physical Exam GENERAL APPEARANCE: Alert, conversant. Appropriately groomed. No acute distress.  VASCULAR: Pedal pulses are  palpable at  DP bilateral.  PT pulses are non-palpable.  Capillary refill time is immediate to all digits,  Normal temperature noted. NEUROLOGIC: sensation is normal to 5.07 monofilament at 5/5 sites bilateral.  Light touch is intact bilateral, Muscle strength normal.  MUSCULOSKELETAL: acceptable muscle strength, tone and stability bilateral.  Intrinsic muscluature intact bilateral.  Rectus appearance of foot and digits noted bilateral.   DERMATOLOGIC: skin color, texture, and turgor are within normal limits.  No preulcerative lesions or ulcers  are seen, no interdigital maceration noted.  No open lesions present.   No drainage noted.  NAILS  Thick disfigured discolored nails both feet.  Pincer nails noted.         Assessment & Plan:  Onychomycosis  Diabetes with angiopathy  IE  Debride nails both feet.  RTC 3 months   Helane GuntherGregory Laria Grimmett DPM

## 2016-11-05 ENCOUNTER — Other Ambulatory Visit
Admission: RE | Admit: 2016-11-05 | Discharge: 2016-11-05 | Disposition: A | Discharge status: Home / Self Care | Payer: Medicare Other | Source: Ambulatory Visit | Attending: Internal Medicine | Admitting: Internal Medicine

## 2016-11-05 ENCOUNTER — Ambulatory Visit (INDEPENDENT_AMBULATORY_CARE_PROVIDER_SITE_OTHER): Payer: Medicare Other | Admitting: Internal Medicine

## 2016-11-05 ENCOUNTER — Encounter: Payer: Self-pay | Admitting: Internal Medicine

## 2016-11-05 ENCOUNTER — Telehealth: Payer: Self-pay | Admitting: Internal Medicine

## 2016-11-05 VITALS — BP 120/62 | HR 68 | Ht 63.0 in | Wt 146.5 lb

## 2016-11-05 DIAGNOSIS — I2510 Atherosclerotic heart disease of native coronary artery without angina pectoris: Secondary | ICD-10-CM

## 2016-11-05 DIAGNOSIS — I5022 Chronic systolic (congestive) heart failure: Secondary | ICD-10-CM | POA: Insufficient documentation

## 2016-11-05 DIAGNOSIS — I10 Essential (primary) hypertension: Secondary | ICD-10-CM

## 2016-11-05 DIAGNOSIS — Z01818 Encounter for other preprocedural examination: Secondary | ICD-10-CM | POA: Insufficient documentation

## 2016-11-05 DIAGNOSIS — R7989 Other specified abnormal findings of blood chemistry: Secondary | ICD-10-CM

## 2016-11-05 DIAGNOSIS — Z01810 Encounter for preprocedural cardiovascular examination: Secondary | ICD-10-CM | POA: Insufficient documentation

## 2016-11-05 DIAGNOSIS — Z79899 Other long term (current) drug therapy: Secondary | ICD-10-CM

## 2016-11-05 LAB — BASIC METABOLIC PANEL
Anion gap: 10 (ref 5–15)
BUN: 49 mg/dL — AB (ref 6–20)
CHLORIDE: 98 mmol/L — AB (ref 101–111)
CO2: 27 mmol/L (ref 22–32)
CREATININE: 2.18 mg/dL — AB (ref 0.44–1.00)
Calcium: 9.6 mg/dL (ref 8.9–10.3)
GFR calc Af Amer: 25 mL/min — ABNORMAL LOW (ref >60)
GFR calc non Af Amer: 21 mL/min — ABNORMAL LOW (ref >60)
GLUCOSE: 217 mg/dL — AB (ref 65–99)
Potassium: 4.2 mmol/L (ref 3.5–5.1)
SODIUM: 135 mmol/L (ref 135–145)

## 2016-11-05 LAB — CBC WITH DIFFERENTIAL/PLATELET
Basophils Absolute: 0 10*3/uL (ref 0–0.1)
Basophils Relative: 0 %
Eosinophils Absolute: 0.1 10*3/uL (ref 0–0.7)
Eosinophils Relative: 3 %
HEMATOCRIT: 37.5 % (ref 35.0–47.0)
Hemoglobin: 12 g/dL (ref 12.0–16.0)
LYMPHS PCT: 38 %
Lymphs Abs: 1.5 10*3/uL (ref 1.0–3.6)
MCH: 26.8 pg (ref 26.0–34.0)
MCHC: 32.2 g/dL (ref 32.0–36.0)
MCV: 83.2 fL (ref 80.0–100.0)
MONO ABS: 0.4 10*3/uL (ref 0.2–0.9)
MONOS PCT: 10 %
NEUTROS ABS: 1.9 10*3/uL (ref 1.4–6.5)
Neutrophils Relative %: 49 %
Platelets: 177 10*3/uL (ref 150–440)
RBC: 4.5 MIL/uL (ref 3.80–5.20)
RDW: 13.3 % (ref 11.5–14.5)
WBC: 3.9 10*3/uL (ref 3.6–11.0)

## 2016-11-05 LAB — PROTIME-INR
INR: 1.04
PROTHROMBIN TIME: 13.6 s (ref 11.4–15.2)

## 2016-11-05 NOTE — Telephone Encounter (Signed)
I spoke with Ms. Fraiser' daughter Jetty Peeks) regarding labs today. Creatinine is elevated to 2.2 (baseline 1.2-1.4). We have agreed to hold furosemide, enalapril, and spironolactone and recheck BMP early next week. We will postpone LHC pending normalization of renal function. Patient to contact us if her BP rises above 140/90 on a consistent basis, given medications being held.  Yvonne Kendall, MD [facility] Pager: [phone]

## 2016-11-05 NOTE — Progress Notes (Signed)
Follow-up Outpatient Visit Date: 11/05/2016  Chief Complaint: Follow-up heart failure  HPI:  Megan Howard is a 71 y.o. year-old female with history of coronary artery disease status post CABG in 12/2015 in [place], ischemic cardiomyopathy, postoperative atrial fibrillation, type 2 diabetes mellitus, and GERD, who presents for follow-up of leg swelling due to systolic heart failure. She underwent CABG in 12/2015; her postoperative course was complicated by rehospitalization and a lengthy stay in rehab.  She moved to [place] to be with her daughter this fall.  I first met her in late November, 2017, at which time she was markedly volume overloaded.  Echocardiogram performed after our first visit revealed biventricular dysfunction with LVEF of 30-35%, which was unchanged on follow-up echo.  I last saw the patient on 09/24/16. She spent the last month in [place], where she ate more than usual and does not follow a "heart healthy diet." She has been feeling well, denying shortness of breath, edema, chest pain, and lightheadedness. She went on several short walks well out of town and did not have any difficulties. She spirits a brief flutter in her chest a few weeks ago without accompanying symptoms. She remains compliant with her medications, though she has been taking furosemide 20 mg twice a day rather than 40 mg once daily.  --------------------------------------------------------------------------------------------------  Cardiovascular History & Procedures: Cardiovascular Problems:  Coronary artery disease  Ischemic cardiomyopathy  Postoperative atrial fibrillation  Risk Factors:  Known coronary artery disease, diabetes mellitus, family history, and age greater than 57  Cath/PCI:  LHC (12/29/15, [facility]): LMCA with mild luminal irregularities. LAD with mild proximal disease and 99% apical stenosis. Codominant LCx with 80% mid vessel stenosis. Small, codominant RCA with 99% mid  and 75-80% distal stenoses. Unsuccessful PCI to mid RCA due to inability to expand stenosis and deploy stent. 80% and 90% residual stenoses remain. LVEDP 21 mmHg. LVEF 45% with inferior hypokinesis.  CV Surgery:  CABG (12/2015 in [place]): SVG to OM2 and SVG to RCA.  EP Procedures and Devices:  Holter monitor (2017): Reportedly no evidence of atrial fibrillation.  Non-Invasive Evaluation(s):  Limited TTE (08/19/16): Normal LV size with mild LVH.  LVEF 30-35%.  MAC with mild MR present.  LA moderately dilated.  RA mildly dilated.  Mild TR.  Mild-to moderate pulmonary hypertension (RVSP 42 mmHg).  TTE (07/03/16): Mildly dilated LV with severely reduced contraction (EF 30-35%). There is diffuse hypokinesis with severe hypokinesis of the anterior and anteroseptal walls. 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 There is mild left atrial enlargement. RV size is moderately enlarged with moderately reduced function. There is moderate TR. Moderate pulmonary hypertension also evident.  TTE (04/20/16): Mild LVH with LVEF 40% and moderate global hypokinesis. Restrictive filling pattern noted. Decreased RV contraction with evidence of RV volume overload. Aortic sclerosis. Mild mitral and moderate tricuspid regurgitation. Moderate to severe pulmonary hypertension.  TTE (02/13/16): Normal LV size and function with LVEF of 67%. Mild LVH present. 2+ tricuspid and 1+ pulmonic regurgitation present. Mild pulmonary hypertension.  Recent CV Pertinent Labs: Lab Results  Component Value Date   CHOL 135 08/01/2016   HDL 25 (L) 08/01/2016   LDLCALC 81 08/01/2016   TRIG 147 08/01/2016   CHOLHDL 5.4 (H) 08/01/2016   INR WILL FOLLOW 07/08/2016   BNP >4,500.0 (H) 06/25/2016   K 4.0 08/27/2016   BUN 17 08/27/2016   CREATININE 1.20 (H) 08/27/2016    Past medical and surgical history were reviewed and updated in EPIC.  Outpatient Encounter Prescriptions as of 11/05/2016  Medication Sig  . aspirin EC  81 MG tablet Take 81 mg by mouth daily.  . carvedilol (COREG) 12.5 MG tablet Take 1 tablet (12.5 mg total) by mouth 2 (two) times daily with a meal.  . enalapril (VASOTEC) 10 MG tablet Take 2 tablets (20 mg total) by mouth 2 (two) times daily.  Marland Kitchen escitalopram (LEXAPRO) 20 MG tablet Take 1 tablet (20 mg total) by mouth daily.  . furosemide (LASIX) 20 MG tablet Take 20 mg by mouth 2 (two) times daily.  Marland Kitchen glipiZIDE (GLUCOTROL) 5 MG tablet Take 1 tablet (5 mg total) by mouth 2 (two) times daily before a meal. For diabetes  . hydrALAZINE (APRESOLINE) 25 MG tablet Take 1 tablet (25 mg total) by mouth 2 (two) times daily.  . insulin glargine (LANTUS) 100 UNIT/ML injection Inject 10 Units into the skin at bedtime.  . isosorbide dinitrate (ISORDIL) 20 MG tablet Take 1 tablet (20 mg total) by mouth 3 (three) times daily.  Marland Kitchen latanoprost (XALATAN) 0.005 % ophthalmic solution Place 1 drop into both eyes at bedtime.  . Magnesium Oxide 400 MG CAPS Take 1 capsule (400 mg total) by mouth daily.  . pantoprazole (PROTONIX) 40 MG tablet Take 1 tablet (40 mg total) by mouth daily.  Marland Kitchen spironolactone (ALDACTONE) 25 MG tablet Take 1 tablet (25 mg total) by mouth daily.  Marland Kitchen atorvastatin (LIPITOR) 20 MG tablet Take 1 tablet (20 mg total) by mouth daily.  . furosemide (LASIX) 40 MG tablet Take 1 tablet (40 mg total) by mouth daily. If wt >2lbs in 24 hours or >5lbs in 1 week, take 40 mg twice a day until returns to baseline wt.   No facility-administered encounter medications on file as of 11/05/2016.     Allergies: Shellfish allergy  Social History   Social History  . Marital status: Single    Spouse name: N/A  . Number of children: N/A  . Years of education: N/A   Occupational History  . Not on file.   Social History Main Topics  . Smoking status: Never Smoker  . Smokeless tobacco: Never Used  . Alcohol use No  . Drug use: No  . Sexual activity: Not on file   Other Topics Concern  . Not on file    Social History Narrative   Single.   Moved from [place].   Now lives with daughter.     Family History  Problem Relation Age of Onset  . Heart attack Sister   . Arrhythmia Brother     s/p AICD  . Cardiomyopathy Brother   . Diabetes type II Brother     Review of Systems: A 12-system review of systems was negative except as noted in the HPI.  --------------------------------------------------------------------------------------------------  Physical Exam: BP 120/62 (BP Location: Left Arm, Patient Position: Sitting, Cuff Size: Normal)   Pulse 68   Ht 5' 3" (1.6 m)   Wt 146 lb 8 oz (66.5 kg)   BMI 25.95 kg/m   General:  Well-developed, well-nourished woman seated comfortably in the exam room. She is accompanied by her daughter. HEENT: No conjunctival pallor or scleral icterus.  Moist mucous membranes.  OP clear. Neck: Supple without lymphadenopathy, thyromegaly. No JVD or HJR.. Lungs: Normal work of breathing. Lungs are clear bilaterally without wheezes or crackles. Heart: Regular rate and rhythm with 1/6 murmur across the precordium. No rubs or gallops. Abd: Bowel sounds present.  Soft, NT/ND without hepatosplenomegaly Ext: No significant lower extremity  edema.  Radial, PT, and DP pulses are 2+ bilaterally. Skin: warm and dry without rash  EKG: Normal sinus rhythm with right axis deviation and nonspecific intraventricular conduction delay. Compared with prior tracing from 09/24/16, right axis deviation is now present and IVCD has replaced left bundle branch block. Question limb lead reversal (I have personally reviewed both tracings).  Lab Results  Component Value Date   WBC 4.0 07/15/2016   HGB 10.5 (L) 07/15/2016   HCT 31.9 (L) 07/15/2016   MCV 82.4 07/15/2016   PLT 218 07/15/2016    Lab Results  Component Value Date   NA 137 08/27/2016   K 4.0 08/27/2016   CL 97 08/27/2016   CO2 22 08/27/2016   BUN 17 08/27/2016   CREATININE 1.20 (H) 08/27/2016   GLUCOSE  204 (H) 08/27/2016   ALT 13 (L) 06/25/2016   --------------------------------------------------------------------------------------------------  ASSESSMENT AND PLAN: Severe chronic systolic heart failure: Ms. Socorro appears euvolemic and well compensated today. She is tolerating her medications well. 6.5 pound weight gain is noted from her prior visit, though she does not have evidence of fluid retention on exam or by history. I wonder if this is predominantly due to diet changes from her month-long visit to [place]. We will check a basic metabolic panel today to reevaluate her renal function and electrolytes. Given persistent severe LV dysfunction on follow-up echo earlier this year, we have agreed to proceed with cardiac catheterization. I have reviewed the risks, indications, and alternatives to cardiac catheterization, possible angioplasty, and stenting with the patient. Risks include but are not limited to bleeding, infection, vascular injury, stroke, myocardial infection, arrhythmia, kidney injury, radiation-related injury in the case of prolonged fluoroscopy use, emergency cardiac surgery, and death. The patient understands the risks of serious complication is 1-2 in 0240 with diagnostic cardiac cath and 1-2% or less with angioplasty/stenting.  Coronary artery disease: The patient does not have any new symptoms to suggest worsening coronary insufficiency. However, given that her LVEF has progressively declined following recovery from CABG last summer, I am concerned about graft failure or worsening native CAD. We will continue with secondary prevention and plan for cardiac catheterization, as above. We will check routine precatheterization labs today.  Hypertension: Blood pressure is well controlled today. We will not make any medication changes at this time.  Follow-up: Return to clinic in 6 weeks.  Nelva Bush, MD 11/06/2016 1:53 PM

## 2016-11-05 NOTE — Patient Instructions (Addendum)
Medication Instructions:  Your physician recommends that you continue on your current medications as directed. Please refer to the Current Medication list given to you today.   Labwork: Your physician recommends that you return for lab work in: TODAY. (BMP, CBC, PT/INR). - Please go to the [facility]. You will check in at the front desk to the right as you walk into the atrium. Valet Parking is offered if needed.    Testing/Procedures: Your physician has requested that you have a cardiac catheterization. Cardiac catheterization is used to diagnose and/or treat various heart conditions. Doctors may recommend this procedure for a number of different reasons. The most common reason is to evaluate chest pain. Chest pain can be a symptom of coronary artery disease (CAD), and cardiac catheterization can show whether plaque is narrowing or blocking your heart's arteries. This procedure is also used to evaluate the valves, as well as measure the blood flow and oxygen levels in different parts of your heart. For further information please visit [email or web address] Please follow instruction sheet, as given.  You are scheduled for a Left Cardiac Catheterization with grafts on 11/13/16 with Dr. Okey Dupre.   1. Please arrive at the [facility] (Main Entrance A) at [facility]: [address] [place], [place] [address] at 05:30 am (two hours before your procedure to ensure your preparation). Free valet parking service is available.   Special note: Every effort is made to have your procedure done on time. Please understand that emergencies sometimes delay scheduled procedures.  2. Diet: Do not eat or drink anything after midnight prior to your procedure except sips of water to take medications.  3. Labs: [facility] at [facility]  4. Medication instructions in preparation for your procedure:  DO NOT TAKE YOUR Glipizide, Furosemide, Enalapril, and Spironolactone the morning of the  procedure. Take 1/2 dose of Lantus the night before.  On the morning of your procedure, take your any other morning medicines NOT listed above.  You may use sips of water.  5. Plan for one night stay--bring personal belongings.  6. Bring a current list of your medications and current insurance cards.  7. You MUST have a responsible person to drive you home.  8. Someone MUST be with you the first 24 hours after you arrive home or your discharge will be delayed.  9. Please wear clothes that are easy to get on and off and wear slip-on shoes.  Thank you for allowing [place] to care for you!   -- [facility]   Follow-Up: Your physician recommends that you schedule a follow-up appointment in: 6 WEEKS WITH DR END.   If you need a refill on your cardiac medications before your next appointment, please call your pharmacy.   Angiogram An angiogram, also called angiography, is a procedure used to look at the blood vessels. In this procedure, dye is injected through a long, thin tube (catheter) into an artery. X-rays are then taken. The X-rays will show if there is a blockage or problem in a blood vessel. Tell a health care provider about:  Any allergies you have, including allergies to shellfish or contrast dye.  All medicines you are taking, including vitamins, herbs, eye drops, creams, and over-the-counter medicines.  Any problems you or family members have had with anesthetic medicines.  Any blood disorders you have.  Any surgeries you have had.  Any previous kidney problems or failure you have had.  Any medical conditions you have.  Possibility  of pregnancy, if this applies. What are the risks? Generally, an angiogram is a safe procedure. However, as with any procedure, problems can occur. Possible problems include:  Injury to the blood vessels, including rupture or bleeding.  Infection or bruising at the catheter site.  Allergic  reaction to the dye or contrast used.  Kidney damage from the dye or contrast used.  Blood clots that can lead to a stroke or heart attack. What happens before the procedure?  Do not eat or drink after midnight on the night before the procedure, or as directed by your health care provider.  Ask your health care provider if you may drink enough water to take any needed medicines the morning of the procedure. What happens during the procedure?  You may be given a medicine to help you relax (sedative) before and during the procedure. This medicine is given through an IV access tube that is inserted into one of your veins.  The area where the catheter will be inserted will be washed and shaved. This is usually done in the groin but may be done in the fold of your arm (near your elbow) or in the wrist.  A medicine will be given to numb the area where the catheter will be inserted (local anesthetic).  The catheter will be inserted with a guide wire into an artery. The catheter is guided by using a type of X-ray (fluoroscopy) to the blood vessel being examined.  Dye is then injected into the catheter, and X-rays are taken. The dye helps to show where any narrowing or blockages are located. What happens after the procedure?  If the procedure is done through the leg, you will be kept in bed lying flat for several hours. You will be instructed to not bend or cross your legs.  The insertion site will be checked frequently.  The pulse in your feet or wrist will be checked frequently.  Additional blood tests, X-rays, and electrocardiography may be done.  You may need to stay in the hospital overnight for observation. This information is not intended to replace advice given to you by your health care provider. Make sure you discuss any questions you have with your health care provider. Document Released: 04/23/2005 Document Revised: 12/26/2015 Document Reviewed: 12/15/2012 Elsevier Interactive  Patient Education  2017 Elsevier Inc.   Angiogram, Care After This sheet gives you information about how to care for yourself after your procedure. Your doctor may also give you more specific instructions. If you have problems or questions, contact your doctor. Follow these instructions at home: Insertion site care   Follow instructions from your doctor about how to take care of your long, thin tube (catheter) insertion area. Make sure you:  Wash your hands with soap and water before you change your bandage (dressing). If you cannot use soap and water, use hand sanitizer.  Change your bandage as told by your doctor.  Leave stitches (sutures), skin glue, or skin tape (adhesive) strips in place. They may need to stay in place for 2 weeks or longer. If tape strips get loose and curl up, you may trim the loose edges. Do not remove tape strips completely unless your doctor says it is okay.  Do not take baths, swim, or use a hot tub until your doctor says it is okay.  You may shower 24-48 hours after the procedure or as told by your doctor.  Gently wash the area with plain soap and water.  Pat the area dry  with a clean towel.  Do not rub the area. This may cause bleeding.  Do not apply powder or lotion to the area. Keep the area clean and dry.  Check your insertion area every day for signs of infection. Check for:  More redness, swelling, or pain.  Fluid or blood.  Warmth.  Pus or a bad smell. Activity   Rest as told by your doctor, usually for 1-2 days.  Do not lift anything that is heavier than 10 lbs. (4.5 kg) or as told by your doctor.  Do not drive for 24 hours if you were given a medicine to help you relax (sedative).  Do not drive or use heavy machinery while taking prescription pain medicine. General instructions   Go back to your normal activities as told by your doctor, usually in about a week. Ask your doctor what activities are safe for you.  If the insertion  area starts to bleed, lie flat and put pressure on the area. If the bleeding does not stop, get help right away. This is an emergency.  Drink enough fluid to keep your pee (urine) clear or pale yellow.  Take over-the-counter and prescription medicines only as told by your doctor.  Keep all follow-up visits as told by your doctor. This is important. Contact a doctor if:  You have a fever.  You have chills.  You have more redness, swelling, or pain around your insertion area.  You have fluid or blood coming from your insertion area.  The insertion area feels warm to the touch.  You have pus or a bad smell coming from your insertion area.  You have more bruising around the insertion area.  Blood collects in the tissue around the insertion area (hematoma) that may be painful to the touch. Get help right away if:  You have a lot of pain in the insertion area.  The insertion area swells very fast.  The insertion area is bleeding, and the bleeding does not stop after holding steady pressure on the area.  The area near or just beyond the insertion area becomes pale, cool, tingly, or numb. These symptoms may be an emergency. Do not wait to see if the symptoms will go away. Get medical help right away. Call your local emergency services (911 in the [place] Do not drive yourself to the hospital. Summary  After the procedure, it is common to have bruising and tenderness at the long, thin tube insertion area.  After the procedure, it is important to rest and drink plenty of fluids.  Do not take baths, swim, or use a hot tub until your doctor says it is okay to do so. You may shower 24-48 hours after the procedure or as told by your doctor.  If the insertion area starts to bleed, lie flat and put pressure on the area. If the bleeding does not stop, get help right away. This is an emergency. This information is not intended to replace advice given to you by your health care provider. Make  sure you discuss any questions you have with your health care provider. Document Released: 10/10/2008 Document Revised: 07/08/2016 Document Reviewed: 07/08/2016 Elsevier Interactive Patient Education  2017 [employer].

## 2016-11-06 NOTE — Telephone Encounter (Signed)
Called Megan Howard in [facility] Cath lab and cath cancelled.

## 2016-11-06 NOTE — Telephone Encounter (Signed)
Order entered for repeat BMP. No answer to patient's daughter's number, ok per DPR. Left detail message with instructions to go to [facility] next Monday or Tuesday for repeat BMP, ok per DPR, and to call back if any questions.

## 2016-11-06 NOTE — Addendum Note (Signed)
Addended by: Stann Mainland on: 11/06/2016 08:09 AM   Modules accepted: Orders

## 2016-11-10 ENCOUNTER — Other Ambulatory Visit
Admission: RE | Admit: 2016-11-10 | Discharge: 2016-11-10 | Disposition: A | Discharge status: Home / Self Care | Payer: Medicare Other | Source: Ambulatory Visit | Attending: Internal Medicine | Admitting: Internal Medicine

## 2016-11-10 DIAGNOSIS — Z79899 Other long term (current) drug therapy: Secondary | ICD-10-CM | POA: Diagnosis not present

## 2016-11-10 DIAGNOSIS — R7989 Other specified abnormal findings of blood chemistry: Secondary | ICD-10-CM | POA: Diagnosis present

## 2016-11-10 LAB — BASIC METABOLIC PANEL
Anion gap: 8 (ref 5–15)
BUN: 27 mg/dL — AB (ref 6–20)
CHLORIDE: 104 mmol/L (ref 101–111)
CO2: 21 mmol/L — ABNORMAL LOW (ref 22–32)
Calcium: 9.4 mg/dL (ref 8.9–10.3)
Creatinine, Ser: 1.6 mg/dL — ABNORMAL HIGH (ref 0.44–1.00)
GFR calc Af Amer: 36 mL/min — ABNORMAL LOW (ref >60)
GFR calc non Af Amer: 31 mL/min — ABNORMAL LOW (ref >60)
Glucose, Bld: 230 mg/dL — ABNORMAL HIGH (ref 65–99)
Potassium: 4.4 mmol/L (ref 3.5–5.1)
SODIUM: 133 mmol/L — AB (ref 135–145)

## 2016-11-10 LAB — HM DIABETES EYE EXAM

## 2016-11-11 ENCOUNTER — Other Ambulatory Visit: Payer: Self-pay | Admitting: *Deleted

## 2016-11-11 ENCOUNTER — Telehealth: Payer: Self-pay | Admitting: Internal Medicine

## 2016-11-11 DIAGNOSIS — Z79899 Other long term (current) drug therapy: Secondary | ICD-10-CM

## 2016-11-11 DIAGNOSIS — I5022 Chronic systolic (congestive) heart failure: Secondary | ICD-10-CM

## 2016-11-11 DIAGNOSIS — R7989 Other specified abnormal findings of blood chemistry: Secondary | ICD-10-CM

## 2016-11-11 NOTE — Telephone Encounter (Signed)
-----   Message from Stann Mainland, RN sent at 11/11/2016  9:23 AM EDT ----- Regarding: appointment for BMP and BP check Patient needs appt for BMP and BP next week on Monday, 11/17/16. Daughter is supposed to call back but wanted to send you a message as well to follow up on. She did not have time to schedule while we were on the phone.  Thanks, Onnie Boer, RN

## 2016-11-11 NOTE — Telephone Encounter (Signed)
r/s for 11/17/16

## 2016-11-11 NOTE — Telephone Encounter (Signed)
Lmov for patient to call back and schedule labs  °

## 2016-11-12 ENCOUNTER — Other Ambulatory Visit: Payer: Self-pay | Admitting: Internal Medicine

## 2016-11-13 ENCOUNTER — Encounter: Payer: Self-pay | Admitting: Primary Care

## 2016-11-13 ENCOUNTER — Ambulatory Visit (HOSPITAL_COMMUNITY): Admit: 2016-11-13 | Payer: Medicare Other | Admitting: Internal Medicine

## 2016-11-13 ENCOUNTER — Encounter (HOSPITAL_COMMUNITY): Payer: Self-pay

## 2016-11-13 SURGERY — LEFT HEART CATH AND CORS/GRAFTS ANGIOGRAPHY
Anesthesia: LOCAL

## 2016-11-14 ENCOUNTER — Telehealth: Payer: Self-pay | Admitting: Internal Medicine

## 2016-11-14 ENCOUNTER — Other Ambulatory Visit: Payer: Self-pay

## 2016-11-14 MED ORDER — ISOSORBIDE DINITRATE 20 MG PO TABS: 20.0000 mg | ORAL_TABLET | Freq: Three times a day (TID) | ORAL | 3 refills | Status: DC

## 2016-11-14 NOTE — Telephone Encounter (Signed)
Pharmacy calling stating that the isosorbide is not covered for patient Would like advise on this  Please call back

## 2016-11-17 ENCOUNTER — Ambulatory Visit (INDEPENDENT_AMBULATORY_CARE_PROVIDER_SITE_OTHER): Payer: Medicare Other | Admitting: *Deleted

## 2016-11-17 ENCOUNTER — Other Ambulatory Visit (INDEPENDENT_AMBULATORY_CARE_PROVIDER_SITE_OTHER): Payer: Medicare Other | Admitting: *Deleted

## 2016-11-17 VITALS — BP 160/93 | HR 68 | Ht 63.0 in

## 2016-11-17 DIAGNOSIS — I10 Essential (primary) hypertension: Secondary | ICD-10-CM | POA: Diagnosis not present

## 2016-11-17 DIAGNOSIS — I5022 Chronic systolic (congestive) heart failure: Secondary | ICD-10-CM | POA: Diagnosis not present

## 2016-11-17 DIAGNOSIS — Z79899 Other long term (current) drug therapy: Secondary | ICD-10-CM

## 2016-11-17 DIAGNOSIS — R7989 Other specified abnormal findings of blood chemistry: Secondary | ICD-10-CM

## 2016-11-17 NOTE — Progress Notes (Signed)
1.) Reason for visit: Lab work and BP check  2.) Name of MD requesting visit: Dr End  3.) H&P: hx: CAD, ischemic cardiomyopathy, diabetes, CABG  4.) ROS related to problem:   Patient here today for labs and BP check. BP today 160/93, HR 68. Patient denies headache, blurred vision, dizziness or swelling. Patient stated her home weight has increased by 2 lbs over the past 3 days. Usually, she weighs about 145lb at home and this morning she was 147lb at home. Reviewed medication list and she stated she is taking what is listed except for the one's Dr End told her daughter to stop on 11/05/16 which were furosemide, enalapril and spironolactone.  5.) Assessment and plan per MD: Advised patient we will continue with current medications and I will route this encounter to Dr End for advice and BP management instructions. She verbalized understanding.

## 2016-11-17 NOTE — Telephone Encounter (Signed)
Called and spoke with patient's daughter, Camelia Eng, on [place]. We discussed the cost and she said it is about $37.00 for the month supply and that it was ok at this time. It sounds like she has been in contact with the pharmacy as well about the cost and was given another number that was higher that ended up being the wrong cost. She is aware the isosorbide dinitrate is not covered by patient's insurance. They are going to pick up the refill today.  Will route to Dr End to make him aware.

## 2016-11-17 NOTE — Telephone Encounter (Signed)
Spoke with Clifton Custard (pharmacy tech) at [facility], he stated the Isosorbide Mononitrate 20 mg is covered under the insurance. Please advise if you would like the patient to switch from the Isosorbide dinitrate to the Isosorbide Mononitrate and if so, what dose?

## 2016-11-18 ENCOUNTER — Other Ambulatory Visit: Payer: Self-pay | Admitting: *Deleted

## 2016-11-18 LAB — BASIC METABOLIC PANEL
BUN / CREAT RATIO: 12 (ref 12–28)
BUN: 18 mg/dL (ref 8–27)
CHLORIDE: 99 mmol/L (ref 96–106)
CO2: 22 mmol/L (ref 18–29)
Calcium: 9.6 mg/dL (ref 8.7–10.3)
Creatinine, Ser: 1.48 mg/dL — ABNORMAL HIGH (ref 0.57–1.00)
GFR calc non Af Amer: 35 mL/min/{1.73_m2} — ABNORMAL LOW (ref >59)
GFR, EST AFRICAN AMERICAN: 40 mL/min/{1.73_m2} — AB (ref >59)
Glucose: 228 mg/dL — ABNORMAL HIGH (ref 65–99)
Potassium: 4.3 mmol/L (ref 3.5–5.2)
Sodium: 137 mmol/L (ref 134–144)

## 2016-11-18 MED ORDER — FUROSEMIDE 20 MG PO TABS: 20.0000 mg | ORAL_TABLET | Freq: Every day | ORAL | 3 refills | Status: DC

## 2016-11-18 MED ORDER — ENALAPRIL MALEATE 20 MG PO TABS: 20.0000 mg | ORAL_TABLET | Freq: Two times a day (BID) | ORAL | 2 refills | Status: DC

## 2016-11-18 NOTE — Telephone Encounter (Signed)
Clarified with Dr End dosage of furosemide is to be 20 mg by mouth once a day and follow instructions as given in results note as follows: Notes recorded by Yvonne Kendall, MD on 11/18/2016 at 11:44 AM EDT Please let Ms. Philbrook and her daughter know that yesterday's BMP shows that Ms. Snowdon kidney function is back near its baseline. Please have her restart enalapril 20 mg BID. Given her recent weigh gain, she should also restart furosemide 20 mg daily. If she continues to gain weight, she should increase it to 20 mg BID and contact our office for further instructions. Let's have her return to see me in the office in 2 weeks, with a BMP at that time.    Patient's daughter has been notified earlier today.

## 2016-11-18 NOTE — Telephone Encounter (Signed)
Continue current medications. As noted in staff message, we should restart her prior dose of enalapril, as well as furosemide 40 mg daily. She should f/u with me in about 2 weeks.

## 2016-11-19 ENCOUNTER — Other Ambulatory Visit: Payer: Self-pay

## 2016-11-19 ENCOUNTER — Telehealth: Payer: Self-pay | Admitting: Internal Medicine

## 2016-11-19 NOTE — Telephone Encounter (Addendum)
Lasix changed to  QD based on recent labs. Daughter understands pt may take  BID if weight continues to increase.  She would then notify the office. [facility]

## 2016-11-19 NOTE — Telephone Encounter (Signed)
Pt daughter calling asking about the medication change She only has 40 mg and 80 mg at home  But we need her to take 20 mg Is asking to call in a new prescription on Furosamide Please advise

## 2016-11-19 NOTE — Telephone Encounter (Signed)
Will you please advise, Thanks!

## 2016-11-24 ENCOUNTER — Encounter: Payer: Self-pay | Admitting: Emergency Medicine

## 2016-11-24 ENCOUNTER — Emergency Department: Payer: Medicare Other

## 2016-11-24 ENCOUNTER — Telehealth: Payer: Self-pay | Admitting: Internal Medicine

## 2016-11-24 DIAGNOSIS — Z7982 Long term (current) use of aspirin: Secondary | ICD-10-CM | POA: Insufficient documentation

## 2016-11-24 DIAGNOSIS — I2510 Atherosclerotic heart disease of native coronary artery without angina pectoris: Secondary | ICD-10-CM | POA: Diagnosis not present

## 2016-11-24 DIAGNOSIS — R0602 Shortness of breath: Secondary | ICD-10-CM | POA: Insufficient documentation

## 2016-11-24 DIAGNOSIS — E119 Type 2 diabetes mellitus without complications: Secondary | ICD-10-CM | POA: Insufficient documentation

## 2016-11-24 DIAGNOSIS — Z79899 Other long term (current) drug therapy: Secondary | ICD-10-CM | POA: Insufficient documentation

## 2016-11-24 DIAGNOSIS — I5021 Acute systolic (congestive) heart failure: Secondary | ICD-10-CM | POA: Insufficient documentation

## 2016-11-24 DIAGNOSIS — Z794 Long term (current) use of insulin: Secondary | ICD-10-CM | POA: Insufficient documentation

## 2016-11-24 DIAGNOSIS — I110 Hypertensive heart disease with heart failure: Secondary | ICD-10-CM | POA: Insufficient documentation

## 2016-11-24 LAB — CBC WITH DIFFERENTIAL/PLATELET
Basophils Absolute: 0 10*3/uL (ref 0–0.1)
Basophils Relative: 1 %
Eosinophils Absolute: 0.1 10*3/uL (ref 0–0.7)
Eosinophils Relative: 3 %
HEMATOCRIT: 33.2 % — AB (ref 35.0–47.0)
HEMOGLOBIN: 11 g/dL — AB (ref 12.0–16.0)
LYMPHS ABS: 1.4 10*3/uL (ref 1.0–3.6)
Lymphocytes Relative: 28 %
MCH: 27.1 pg (ref 26.0–34.0)
MCHC: 33.1 g/dL (ref 32.0–36.0)
MCV: 81.8 fL (ref 80.0–100.0)
MONOS PCT: 7 %
Monocytes Absolute: 0.3 10*3/uL (ref 0.2–0.9)
NEUTROS PCT: 63 %
Neutro Abs: 3.1 10*3/uL (ref 1.4–6.5)
Platelets: 192 10*3/uL (ref 150–440)
RBC: 4.06 MIL/uL (ref 3.80–5.20)
RDW: 13.7 % (ref 11.5–14.5)
WBC: 5 10*3/uL (ref 3.6–11.0)

## 2016-11-24 LAB — COMPREHENSIVE METABOLIC PANEL
ALK PHOS: 93 U/L (ref 38–126)
ALT: 14 U/L (ref 14–54)
ANION GAP: 9 (ref 5–15)
AST: 29 U/L (ref 15–41)
Albumin: 3.8 g/dL (ref 3.5–5.0)
BILIRUBIN TOTAL: 1 mg/dL (ref 0.3–1.2)
BUN: 17 mg/dL (ref 6–20)
CALCIUM: 9.2 mg/dL (ref 8.9–10.3)
CO2: 25 mmol/L (ref 22–32)
Chloride: 104 mmol/L (ref 101–111)
Creatinine, Ser: 1.31 mg/dL — ABNORMAL HIGH (ref 0.44–1.00)
GFR, EST AFRICAN AMERICAN: 46 mL/min — AB (ref >60)
GFR, EST NON AFRICAN AMERICAN: 40 mL/min — AB (ref >60)
Glucose, Bld: 134 mg/dL — ABNORMAL HIGH (ref 65–99)
Potassium: 3.2 mmol/L — ABNORMAL LOW (ref 3.5–5.1)
Sodium: 138 mmol/L (ref 135–145)
Total Protein: 8.7 g/dL — ABNORMAL HIGH (ref 6.5–8.1)

## 2016-11-24 LAB — TROPONIN I: Troponin I: <0.03 ng/mL (ref <0.03)

## 2016-11-24 MED ORDER — FUROSEMIDE 20 MG PO TABS: 20.0000 mg | ORAL_TABLET | Freq: Every day | ORAL | 3 refills | Status: DC

## 2016-11-24 NOTE — Telephone Encounter (Signed)
Pt is returning your call

## 2016-11-24 NOTE — Telephone Encounter (Signed)
Returned call to patient's daughter, ok per DPR. Since Friday, patient has had SOB with exertion.  She gets SOB after climbing the stairs. She get SOB at the end of her usual walk where before she did not. Patient is able to do simple ADL's such as walk to bathroom and back without SOB. Daily weight is up by 3 lbs. Usual weight is 143-145lb. Weight on 4/23 148lb 4/25  146lb 4/30  148lb. Furosemide was restarted on 11/18/16. Daughter started patient on furosemide 40 mg because that is what she had at home until she could get the prescription. Patient lost 2 lbs after 2 doses of furosemide 40 mg, one on Tuesday and then on Wednesday. She called Wednesday to have new Rx re-sent as pharmacy did not have yet.  We checked and it looked like Rx was sent 11/18/16; however, it was not received at pharmacy. Rx for furosemide 20 mg daily re-sent in today. Daughter stated she tried to split the furosemide 40 mg tablet in half but did not feel like that was working; therefore, patient has continued on furosemide 40 mg daily since 11/18/16. Denies swelling, chest pain or palpitations. Daughter did not have recent BP readings. Attempted to call patient directly to get readings but no answer.  Daughter is also concerned because the patient mentioned she felt she had a "death rattle" from 23-Nov-2016 until 11/18/16 when she restarted her furosemide.  The patient told her daughter she does not have that now.   Next appt with Dr End 12/03/16. Advised I will route to Dr End for further advice.

## 2016-11-24 NOTE — Telephone Encounter (Signed)
Let's have her do furosemide 40 mg BID for three days, then 40 mg daily thereafter. If her breathing has not improved, she should contact us for further instructions. Thanks.

## 2016-11-24 NOTE — Telephone Encounter (Signed)
Pt daughter states pt "numbers" are still high, states she is have some trouble breathing and getting tired easily,having trouble sleeping at night since this past Friday. She states she thinks pt has fluid around her heart.

## 2016-11-24 NOTE — Telephone Encounter (Signed)
Attempted to call patient back. No VM set up.

## 2016-11-24 NOTE — ED Notes (Signed)
Lab results reviewed. Awaiting room for MD eval.  

## 2016-11-24 NOTE — ED Triage Notes (Signed)
Pt presents to ED with sob with exertion for the past few days. Pt states she started feeling this way a few days ago. Denies cough or chest pain. Pt states she occasionally feels a tightness across her chest but not currently. Pt has no increased work of breathing noted at this time. pt states the last time she felt this way, it was when she had her heart attack.

## 2016-11-25 ENCOUNTER — Encounter: Payer: Self-pay | Admitting: Primary Care

## 2016-11-25 ENCOUNTER — Emergency Department
Admission: EM | Admit: 2016-11-25 | Discharge: 2016-11-25 | Disposition: A | Discharge status: Home / Self Care | Payer: Medicare Other | Attending: Emergency Medicine | Admitting: Emergency Medicine

## 2016-11-25 DIAGNOSIS — R0602 Shortness of breath: Secondary | ICD-10-CM

## 2016-11-25 LAB — TROPONIN I: Troponin I: <0.03 ng/mL (ref <0.03)

## 2016-11-25 MED ORDER — POTASSIUM CHLORIDE ER 20 MEQ PO TBCR: EXTENDED_RELEASE_TABLET | ORAL | 3 refills | Status: AC

## 2016-11-25 MED ORDER — POTASSIUM CHLORIDE CRYS ER 20 MEQ PO TBCR
20.0000 meq | EXTENDED_RELEASE_TABLET | Freq: Once | ORAL | Status: AC
  Administered 2016-11-25: 20 meq via ORAL
  Filled 2016-11-25: qty 1

## 2016-11-25 MED ORDER — FUROSEMIDE 40 MG PO TABS: ORAL_TABLET | ORAL | 3 refills | Status: DC

## 2016-11-25 NOTE — ED Provider Notes (Signed)
Medical screening examination/treatment/procedure(s) were conducted as a shared visit with non-physician practitioner(s) and myself.  I personally evaluated the patient during the encounter.  EKGs interpreted by me Initial EKG performed at 9:08 PM shows normal sinus rhythm rate of 86, left axis, normal intervals. Left bundle branch block with T-wave inversions in 1 aVL and V6. This is different from previous EKG 11/05/2016 which shows a right axis and diffuse inferior and lateral T-wave inversions. Because of reversal of bacterial multiple leads I suspect that there is limb lead reversal.  Repeat EKG performed in the ED today at 1:10 AM shows: Sinus rhythm rate of 68, normal axis and intervals. Normal QRS. Interventricular conduction delay. T wave inversions in V5 V6 and inferior leads. Not changed from previous EKG on 11/05/2016.  Patient presents with mild shortness of breath, history is strongly consistent with this being a mild decompensation of chronic heart failure due to a trial of discontinuing her diuretic. This was not well tolerated, so the patient restarted and is already feeling better after taking this at home. Mild hypokalemia was repleted in the ED. Initial EKG shows some changes but likely had limb lead reversal. Repeat EKG is consistent with prior baseline.  Considering the patient's symptoms, medical history, and physical examination today, I have low suspicion for ACS, PE, TAD, pneumothorax, carditis, mediastinitis, pneumonia, or sepsis.  Suitable for outpatient follow-up.  Final diagnoses:  SOB (shortness of breath)  Mild hypokalemia      Sharman Cheek, MD 11/25/16 [phone]

## 2016-11-25 NOTE — ED Provider Notes (Signed)
[facility] Emergency Department Provider Note   ____________________________________________   None    (approximate)  I have reviewed the triage vital signs and the nursing notes.   HISTORY  Chief Complaint Shortness of Breath   HPI Megan Howard is a 71 y.o. female who presents to the emergency department for evaluation of shortness of breath. Symptoms started either Thursday or Friday and have continued through the weekend and today. Daughter states that cardiology had taken her off of her lasix in hopes to restore renal function to a safe level to perform a cardiac cath due to worsening of LVEF. She was restarted on the lasix on 11/18/16. She has had no chest pain or palpitations with the shortness of breath.   Past Medical History:  Diagnosis Date  . Anemia   . Cardiomyopathy, ischemic   . Coronary artery disease   . Diabetes mellitus without complication (HCC)   . Dyslipidemia   . Hypertension     Patient Active Problem List   Diagnosis Date Noted  . Chronic systolic heart failure (HCC) 08/28/2016  . Mass of left thigh 08/28/2016  . Depression 08/01/2016  . Insomnia 08/01/2016  . Vaginal bleeding 07/16/2016  . Acute systolic heart failure (HCC) 07/10/2016  . Essential hypertension 07/10/2016  . Ischemic cardiomyopathy 06/25/2016  . Paroxysmal atrial fibrillation (HCC) 06/25/2016  . Coronary artery disease involving native heart without angina pectoris 06/17/2016  . Anxiety and depression 06/17/2016  . Impaired mobility and ADLs 06/17/2016  . Type 2 diabetes mellitus (HCC) 06/17/2016  . GERD (gastroesophageal reflux disease) 06/17/2016    Past Surgical History:  Procedure Laterality Date  . CARDIAC CATHETERIZATION    . CORONARY ANGIOPLASTY    . CORONARY ARTERY BYPASS GRAFT  12/2015   CABG x 2    Prior to Admission medications   Medication Sig Start Date End Date Taking? Authorizing Provider  aspirin EC 81 MG tablet Take 81 mg by  mouth daily.    Historical Provider, MD  atorvastatin (LIPITOR) 20 MG tablet Take 1 tablet (20 mg total) by mouth daily. 08/01/16 10/30/16  Yvonne Kendall, MD  carvedilol (COREG) 12.5 MG tablet Take 1 tablet (12.5 mg total) by mouth 2 (two) times daily with a meal. 08/29/16   Doreene Nest, NP  enalapril (VASOTEC) 20 MG tablet Take 1 tablet (20 mg total) by mouth 2 (two) times daily. 11/18/16   Christopher End, MD  escitalopram (LEXAPRO) 20 MG tablet Take 1 tablet (20 mg total) by mouth daily. 09/10/16   Doreene Nest, NP  glipiZIDE (GLUCOTROL) 5 MG tablet Take 1 tablet (5 mg total) by mouth 2 (two) times daily before a meal. For diabetes 08/31/16   Doreene Nest, NP  hydrALAZINE (APRESOLINE) 25 MG tablet Take 1 tablet (25 mg total) by mouth 2 (two) times daily. 07/30/16   Doreene Nest, NP  insulin glargine (LANTUS) 100 UNIT/ML injection Inject 10 Units into the skin at bedtime.    Historical Provider, MD  isosorbide dinitrate (ISORDIL) 20 MG tablet Take 1 tablet (20 mg total) by mouth 3 (three) times daily. 11/14/16   Christopher End, MD  latanoprost (XALATAN) 0.005 % ophthalmic solution Place 1 drop into both eyes at bedtime.    Historical Provider, MD  Magnesium Oxide 400 MG CAPS Take 1 capsule (400 mg total) by mouth daily. 07/30/16   Doreene Nest, NP  pantoprazole (PROTONIX) 40 MG tablet Take 1 tablet (40 mg total) by mouth daily. 07/30/16  Doreene Nest, NP  Potassium Chloride ER 20 MEQ TBCR Take 2 tablets (40 mEq) by mouth once a day. 11/25/16   Yvonne Kendall, MD  torsemide (DEMADEX) 20 MG tablet Take 2 tablets (40 mg total) by mouth daily. 11/26/16 02/24/17  Raymon Mutton Dunn, PA-C    Allergies Shellfish allergy  Family History  Problem Relation Age of Onset  . Heart attack Sister   . Arrhythmia Brother     s/p AICD  . Cardiomyopathy Brother   . Diabetes type II Brother     Social History Social History  Substance Use Topics  . Smoking status: Never Smoker  . Smokeless  tobacco: Never Used  . Alcohol use No    Review of Systems  Constitutional: No fever/chills Eyes: No visual changes. ENT: No sore throat. Cardiovascular: Denies chest pain. Respiratory: Positive for shortness of breath. Gastrointestinal: No abdominal pain.  No nausea, no vomiting.  No diarrhea.  No constipation. Genitourinary: Negative for dysuria. Musculoskeletal: Negative for back pain. Skin: Negative for rash. Neurological: Negative for headaches, focal weakness or numbness. ____________________________________________   PHYSICAL EXAM:  VITAL SIGNS: ED Triage Vitals  Enc Vitals Group     BP 11/24/16 2119 (!) 175/91     Pulse Rate 11/24/16 2119 85     Resp 11/24/16 2119 18     Temp 11/24/16 2119 98.7 F (37.1 C)     Temp Source 11/24/16 2119 Oral     SpO2 11/24/16 2119 94 %     Weight 11/24/16 2118 148 lb (67.1 kg)     Height 11/24/16 2118  (1.6 m)     Head Circumference --      Peak Flow --      Pain Score --      Pain Loc --      Pain Edu? --      Excl. in GC? --     Constitutional: Alert and oriented. Well appearing and in no acute distress. Eyes: Conjunctivae are normal. EOMI. Head: Atraumatic. Nose: No congestion/rhinnorhea. Mouth/Throat: Mucous membranes are moist.   Neck: No stridor.   Cardiovascular: Normal rate, regular rhythm. Grossly normal heart sounds.  Good peripheral circulation. Respiratory: Normal respiratory effort.  No retractions. Lungs CTAB. Gastrointestinal: Soft and nontender. No distention. No abdominal bruits.  Musculoskeletal: No lower extremity tenderness nor edema.   Neurologic:  Normal speech and language. No gross focal neurologic deficits are appreciated. No gait instability. Skin:  Skin is warm, dry and intact. No rash noted. Psychiatric: Mood and affect are normal. Speech and behavior are normal. ____________________________________________   LABS (all labs ordered are listed, but only abnormal results are  displayed)  Labs Reviewed  CBC WITH DIFFERENTIAL/PLATELET - Abnormal; Notable for the following:       Result Value   Hemoglobin 11.0 (*)    HCT 33.2 (*)    All other components within normal limits  COMPREHENSIVE METABOLIC PANEL - Abnormal; Notable for the following:    Potassium 3.2 (*)    Glucose, Bld 134 (*)    Creatinine, Ser 1.31 (*)    Total Protein 8.7 (*)    GFR calc non Af Amer 40 (*)    GFR calc Af Amer 46 (*)    All other components within normal limits  TROPONIN I  TROPONIN I   ____________________________________________  EKG  Normal sinus rhythm ____________________________________________  RADIOLOGY  Mild congestive heart failure per radiology. ____________________________________________   PROCEDURES  Procedure(s) performed: None  Procedures  Critical  Care performed: No  ____________________________________________   INITIAL IMPRESSION / ASSESSMENT AND PLAN / ED COURSE  Pertinent labs & imaging results that were available during my care of the patient were reviewed by me and considered in my medical decision making (see chart for details).  71 year old female Presenting to the emergency department for evaluation of shortness of breath. Symptoms resolved while in the waiting room. She denies ever having had chest pain since the onset of her symptoms Thursday or Friday. Shortness of breath likely related to stopping her Lasix for 2 weeks as directed by her cardiologist. She restarted her Lasix on the 24th. She was treated for mild hypokalemia tonight and given 20 meq of K+. She was encouraged to return to the ER if her symptoms return/worsen or for onset of chest pain. She was advised to schedule an appointment with her PCP and notify cardiology of her shortness of breath.   ____________________________________________   FINAL CLINICAL IMPRESSION(S) / ED DIAGNOSES  Final diagnoses:  SOB (shortness of breath)      NEW MEDICATIONS STARTED  DURING THIS VISIT:  Discharge Medication List as of 11/25/2016  2:12 AM       Note:  This document was prepared using Dragon voice recognition software and may include unintentional dictation errors.    Chinita Pester, FNP 11/28/16 [id number]    Sharman Cheek, MD 12/01/16 2219

## 2016-11-25 NOTE — Telephone Encounter (Signed)
No answer. Left message to call back with patient's daughter.  Below is additional message received from Dr End:  Yvonne Kendall, MD  Stann Mainland, RN        Cleone Slim Victorino Dike,   I see that Ms. Argueta was in the ED overnight. Can you set her up to see me or Alycia Rossetti sometimes this week? In addition to the furosemide that I messaged about (40 mg BID x 3 days, then 40 mg daily thereafter), can you also start her on potassium chloride 40 mEq daily? Thanks.   Thayer Ohm

## 2016-11-25 NOTE — Discharge Instructions (Signed)
Please call and schedule an appointment with your primary care provider or your cardiologist.   Continue your medications including your lasix as prescribed.  Return to the Emergency Department for symptoms that change or worsen if unable to schedule an appointment with primary care or the cardiologist.

## 2016-11-25 NOTE — Telephone Encounter (Signed)
Received incoming call from Terri, patient's daughter. Instructions for furosemide 40 mg by mouth twice a day for 3 days, then 40 mg once a day and Potassium 40 mEq by mouth once a day given to patient's daughter. She wrote this down and read back instructions. She has furosemide 40 mg tablets and potassium 20 mEq tablets at home already. She verbalized understanding to have patient take Potassium 2 tablets to equal 40 mEq by mouth once a day. Appt scheduled to see Eula Listen, PA tomorrow, 11/26/16 at 2 pm. She verbalized understanding and will call us if any new concerns arise.

## 2016-11-25 NOTE — ED Notes (Signed)
Pt denies recent illness. States SOB x3 days with exertion, Cardiologist told pt to come for eval if no better by night, so pt came for evaluation

## 2016-11-26 ENCOUNTER — Ambulatory Visit (INDEPENDENT_AMBULATORY_CARE_PROVIDER_SITE_OTHER): Payer: Medicare Other | Admitting: Physician Assistant

## 2016-11-26 ENCOUNTER — Encounter: Payer: Self-pay | Admitting: Physician Assistant

## 2016-11-26 VITALS — BP 148/74 | HR 65 | Ht 63.0 in | Wt 149.8 lb

## 2016-11-26 DIAGNOSIS — I5023 Acute on chronic systolic (congestive) heart failure: Secondary | ICD-10-CM

## 2016-11-26 DIAGNOSIS — I10 Essential (primary) hypertension: Secondary | ICD-10-CM

## 2016-11-26 DIAGNOSIS — N179 Acute kidney failure, unspecified: Secondary | ICD-10-CM | POA: Diagnosis not present

## 2016-11-26 DIAGNOSIS — I480 Paroxysmal atrial fibrillation: Secondary | ICD-10-CM | POA: Diagnosis not present

## 2016-11-26 DIAGNOSIS — I2510 Atherosclerotic heart disease of native coronary artery without angina pectoris: Secondary | ICD-10-CM

## 2016-11-26 MED ORDER — TORSEMIDE 20 MG PO TABS: 40.0000 mg | ORAL_TABLET | Freq: Every day | ORAL | 3 refills | Status: DC

## 2016-11-26 NOTE — Patient Instructions (Addendum)
Medication Instructions:  Your physician has recommended you make the following change in your medication:  STOP taking lasix START taking torsemide  (2 tablets) once daily   Labwork: BMET today BMET Friday  Testing/Procedures: none  Follow-Up: Your physician recommends that you keep your follow up appointment with Dr. Okey Dupre.    Any Other Special Instructions Will Be Listed Below (If Applicable). Weight check on Friday when here for your lab work.      If you need a refill on your cardiac medications before your next appointment, please call your pharmacy.

## 2016-11-26 NOTE — Progress Notes (Signed)
Cardiology Office Note Date:  11/26/2016  Patient ID:  Megan Howard, Megan Howard 1945-09-09, MRN [id number] PCP:  Morrie Sheldon, NP  Cardiologist:  Dr. Okey Dupre, MD    Chief Complaint: ED follow up  History of Present Illness: Megan Howard is a 71 y.o. female with history of CAD s/p 2-vessel CABG in 12/2015 with SVG-OM2 and SVG-RCA in [place], [facility] with bi-ventricular failure, postoperative Afib, DM2, and GERD who presents for evaluation of increased SOB.   She underwent CABG in 12/2015 with a post-operative course that was complicated by rehospitalization and lengthy stay in rehab. She moved to [place] to be with her daughter, established with Dr. Okey Dupre in 05/2016 and was noted to be markedly volume overloaded at that time. Echo on 07/03/16 showed EF 30-35%, diffuse hypokinesis with severe hypokinesis of the anterior and anteroseptal myocardium. Calcified mitral annulus with mild mitral regurgitation, left atrium was mildly dilated at 45 mm, RV cavity size was moderately dilated with moderate reduction in RV systolic function, moderate tricuspid regurgitation, PASP 51 mmHg. Follow up echo on 08/19/2016 showed no significant change from prior study above (see details below). Traveled back to [place] in March 2018 and did not follow a heart healthy diet along with eating more than usual. At her last follow up with Dr. Okey Dupre on 11/05/2016 she had noted a brief episode palpitations without associated symptoms. Weight was up 6.5 pounds at that time from prior visit in 08/2016 and felt to be related to diet changes and not fluid. She was initially scheduled for LHC given her persistly low EF as noted above. However, pre-cath labs on 4/11 showed a worsening in her renal function at 2.2 with a baseline of 1.2-1.4. At that time her Lasix, spironolactone, and enalapril were held. Follow up bmet on 11/10/16 showed some improvement in her SCr at 1.60 though not back to baseline. She was advised to continue to hold Lasix with RN  visit in 1 week. RN visit and recheck bmet on 4/23 showed her SCr had returned to her approximate baseline at 1.48. Her home weight had trended to 147 pounds (baseline 145 pounds). She was restarted on Lasix 20 mg daily (prior dose 40 mg daily) and enalapirl 20 mg bid.   Patient's daughter called on 4/30 with report of patient having continued increased SOB and weight gain to 148 pounds on 4/30 (baseline 145 pounds as above). She was getting SOB with ambulation in the house. It was noted at that time the patient had not been taking Lasix 20 mg daily as insructed, she was taking 40 mg daily. She was instructed to take Lasix 40 mg bid from 4/30-5/2 then 40 mg daily thereafter.   Patient presented to [facility] ED overnight 4/30 into 5/1 the above increased SOB that dated back to 4/26-4/27. Work up in the ED showed BP 175/91, oxygen saturation 94%, weight 148 pounds, CXR with mild CHF, troponin negative x 2, SCr 1.31, K+ 3.2, albumin 3.8, hgb 11.0, EKG NSR, 86 bpm, LBBB (known). She was given KCl 20 mEq in the ED and advised to follow up as an outpatient. Dr. Okey Dupre advised patient to increase KCl to 40 mEq daily and continue previously discussed Lasix 40 mg bid through 5/2 then 40 mg daily thereafter with follow up this week.   She comes in today reporting she has continued to note a home weight in the 148-149 pound range. Today is her last day of Lasix 40 mg twice a day as above. She denies any lower  extremity swelling though does feel like her abdomen is distended. She denies any orthopnea, PND, cough, or early satiety. She does report eating canned tuna fish frequently. Denies any chest pain, palpitations, diaphoresis, nausea, vomiting, dizziness, presyncope, or syncope.   Past Medical History:  Diagnosis Date  . Anemia   . Cardiomyopathy, ischemic   . Coronary artery disease   . Diabetes mellitus without complication (HCC)   . Dyslipidemia   . Hypertension     Past Surgical History:  Procedure  Laterality Date  . CARDIAC CATHETERIZATION    . CORONARY ANGIOPLASTY    . CORONARY ARTERY BYPASS GRAFT  12/2015   CABG x 2    Current Outpatient Prescriptions  Medication Sig Dispense Refill  . aspirin EC 81 MG tablet Take 81 mg by mouth daily.    . carvedilol (COREG) 12.5 MG tablet Take 1 tablet (12.5 mg total) by mouth 2 (two) times daily with a meal. 180 tablet 3  . enalapril (VASOTEC) 20 MG tablet Take 1 tablet (20 mg total) by mouth 2 (two) times daily. 180 tablet 2  . escitalopram (LEXAPRO) 20 MG tablet Take 1 tablet (20 mg total) by mouth daily. 90 tablet 1  . furosemide (LASIX) 40 MG tablet Take 1 tablet (40 mg) by mouth twice a day for 3 days, THEN take 1 tablet (40 mg) by mouth once a day. 90 tablet 3  . glipiZIDE (GLUCOTROL) 5 MG tablet Take 1 tablet (5 mg total) by mouth 2 (two) times daily before a meal. For diabetes 180 tablet 1  . hydrALAZINE (APRESOLINE) 25 MG tablet Take 1 tablet (25 mg total) by mouth 2 (two) times daily. 180 tablet 1  . insulin glargine (LANTUS) 100 UNIT/ML injection Inject 10 Units into the skin at bedtime.    . isosorbide dinitrate (ISORDIL) 20 MG tablet Take 1 tablet (20 mg total) by mouth 3 (three) times daily. 90 tablet 3  . latanoprost (XALATAN) 0.005 % ophthalmic solution Place 1 drop into both eyes at bedtime.    . Magnesium Oxide 400 MG CAPS Take 1 capsule (400 mg total) by mouth daily. 90 capsule 1  . pantoprazole (PROTONIX) 40 MG tablet Take 1 tablet (40 mg total) by mouth daily. 90 tablet 1  . Potassium Chloride ER 20 MEQ TBCR Take 2 tablets (40 mEq) by mouth once a day. 60 tablet 3  . atorvastatin (LIPITOR) 20 MG tablet Take 1 tablet (20 mg total) by mouth daily. 90 tablet 3   No current facility-administered medications for this visit.     Allergies:   Shellfish allergy   Social History:  The patient  reports that she has never smoked. She has never used smokeless tobacco. She reports that she does not drink alcohol or use drugs.    Family History:  The patient's family history includes Arrhythmia in her brother; Cardiomyopathy in her brother; Diabetes type II in her brother; Heart attack in her sister.  ROS:   Review of Systems  Constitutional: Positive for malaise/fatigue. Negative for chills, diaphoresis, fever and weight loss.  HENT: Negative for congestion.   Eyes: Negative for discharge and redness.  Respiratory: Positive for shortness of breath. Negative for cough, hemoptysis, sputum production and wheezing.   Cardiovascular: Negative for chest pain, palpitations, orthopnea, claudication, leg swelling and PND.  Gastrointestinal: Negative for abdominal pain, blood in stool, heartburn, melena, nausea and vomiting.  Genitourinary: Negative for hematuria.  Musculoskeletal: Negative for falls and myalgias.  Skin: Negative for rash.  Neurological:  Positive for weakness. Negative for dizziness, tingling, tremors, sensory change, speech change, focal weakness and loss of consciousness.  Endo/Heme/Allergies: Does not bruise/bleed easily.  Psychiatric/Behavioral: Negative for substance abuse. The patient is not nervous/anxious.   All other systems reviewed and are negative.    PHYSICAL EXAM:  VS:  BP (!) 148/74 (BP Location: Left Arm, Patient Position: Sitting, Cuff Size: Normal)   Pulse 65   Ht  (1.6 m)   Wt 149 lb 12 oz (67.9 kg)   BMI 26.53 kg/m  BMI: Body mass index is 26.53 kg/m.  Physical Exam  Constitutional: She is oriented to person, place, and time. She appears well-developed and well-nourished.  HENT:  Head: Normocephalic and atraumatic.  Eyes: Right eye exhibits no discharge. Left eye exhibits no discharge.  Neck: Normal range of motion. JVD present.  JVD elevated approximate 6 cm  Cardiovascular: Normal rate, regular rhythm, S1 normal and S2 normal.  Exam reveals no distant heart sounds, no friction rub, no midsystolic click and no opening snap.   Murmur heard.  Systolic murmur is present  with a grade of 1/6  Pulmonary/Chest: Effort normal. No respiratory distress. She has no decreased breath sounds. She has no wheezes. She has rales. She exhibits no tenderness.  Faint bibasilar crackles  Abdominal: Soft. She exhibits no distension. There is no tenderness.  Musculoskeletal: She exhibits no edema.  Neurological: She is alert and oriented to person, place, and time.  Skin: Skin is warm and dry. No cyanosis. Nails show no clubbing.  Psychiatric: She has a normal mood and affect. Her speech is normal and behavior is normal. Judgment and thought content normal.    EKG:  Was ordered and interpreted by me today. Shows NSR, 65 BPM, LBBB (known)  Recent Labs: 06/25/2016: B Natriuretic Peptide >4,500.0 07/16/2016: TSH 2.950 11/24/2016: ALT 14; BUN 17; Creatinine, Ser 1.31; Hemoglobin 11.0; Platelets 192; Potassium 3.2; Sodium 138  08/01/2016: Chol/HDL Ratio 5.4; Cholesterol, Total 135; HDL 25; LDL Calculated 81; Triglycerides 147   Estimated Creatinine Clearance: 35.9 mL/min (A) (by C-G formula based on SCr of 1.31 mg/dL (H)).   Wt Readings from Last 3 Encounters:  11/26/16 149 lb 12 oz (67.9 kg)  11/24/16 148 lb (67.1 kg)  11/05/16 146 lb 8 oz (66.5 kg)     Other studies reviewed: Additional studies/records reviewed today include: summarized above  ASSESSMENT AND PLAN:  1. Acute on chronic systolic CHF: Quite possibly in the setting of dietary indiscretion as patient eats canned tuna fish frequently as well as does not limit her by mouth fluid intake. Will change patient's Lasix to torsemide 40 mg daily and continue her on her KCl 20 mEq daily. Continue carvedilol 12.5 mg twice a day, enalapril 20 mg twice a day, Isordil 20 mg 3 times a day. CHF education provided. Check bmet. If weight returns to baseline of approximately 145 pounds and renal function remained stable at her one-week follow-up with Dr. Okey Dupre perhaps she could then undergo cardiac catheterization to further evaluate  her persistent cardiomyopathy.  2. CAD as above without angina: No symptoms concerning for angina at this time. Continue ASA 81 mg daily, carvedilol as above, and Lipitor. Perhaps patient will be able to successfully undergo cardiac catheterization as above pending her diuresis and renal function.  3. HTN: Modestly controlled. Patient reports better readings at home. For now, will continue current antihypertensives as above.  4. History of present illness: Check bmet.  Disposition: F/u with RN visit for bmet and  weight check on 5/4 and Dr. Okey Dupre in 1 week.  Current medicines are reviewed at length with the patient today.  The patient did not have any concerns regarding medicines.  Elinor Dodge PA-C 11/26/2016 2:09 PM     [facility] [place] [address] [place], [place] [address] [phone]

## 2016-11-27 LAB — BASIC METABOLIC PANEL
BUN/Creatinine Ratio: 13 (ref 12–28)
BUN: 19 mg/dL (ref 8–27)
CO2: 20 mmol/L (ref 18–29)
CREATININE: 1.43 mg/dL — AB (ref 0.57–1.00)
Calcium: 9.2 mg/dL (ref 8.7–10.3)
Chloride: 100 mmol/L (ref 96–106)
GFR, EST AFRICAN AMERICAN: 42 mL/min/{1.73_m2} — AB (ref >59)
GFR, EST NON AFRICAN AMERICAN: 37 mL/min/{1.73_m2} — AB (ref >59)
Glucose: 168 mg/dL — ABNORMAL HIGH (ref 65–99)
POTASSIUM: 4.2 mmol/L (ref 3.5–5.2)
SODIUM: 137 mmol/L (ref 134–144)

## 2016-11-28 ENCOUNTER — Ambulatory Visit (INDEPENDENT_AMBULATORY_CARE_PROVIDER_SITE_OTHER): Payer: Medicare Other

## 2016-11-28 ENCOUNTER — Other Ambulatory Visit (INDEPENDENT_AMBULATORY_CARE_PROVIDER_SITE_OTHER): Payer: Medicare Other

## 2016-11-28 DIAGNOSIS — R609 Edema, unspecified: Secondary | ICD-10-CM | POA: Insufficient documentation

## 2016-11-28 DIAGNOSIS — R600 Localized edema: Secondary | ICD-10-CM

## 2016-11-28 DIAGNOSIS — I5023 Acute on chronic systolic (congestive) heart failure: Secondary | ICD-10-CM | POA: Diagnosis not present

## 2016-11-28 DIAGNOSIS — R0602 Shortness of breath: Secondary | ICD-10-CM

## 2016-11-28 NOTE — Patient Instructions (Signed)
1.) Reason for visit: Weight check  2.) Name of MD requesting visit: Eula ListenRyan Dunn, PA-C  3.) H&P: Pt had May 2 office visit for dyspnea and edema. Weight at 149.12lbs. Pt reported shortness of breath. Lasix discontinued and torsemide 40mg  qd added.   4.) ROS related to problem: Pt had repeat BMET today. Weight is now 147.0 lbs  Pt reports feeling much better with improvement in edema and breathing.   5.) Assessment and plan per MD: Pt understands to continue medications as scheduled and await further instructions after labs results. Routed nurse visit documentation to Eula Listenyan Dunn, PA-C.

## 2016-11-29 LAB — BASIC METABOLIC PANEL
BUN/Creatinine Ratio: 15 (ref 12–28)
BUN: 24 mg/dL (ref 8–27)
CALCIUM: 9.2 mg/dL (ref 8.7–10.3)
CO2: 20 mmol/L (ref 18–29)
Chloride: 98 mmol/L (ref 96–106)
Creatinine, Ser: 1.57 mg/dL — ABNORMAL HIGH (ref 0.57–1.00)
GFR calc Af Amer: 38 mL/min/{1.73_m2} — ABNORMAL LOW (ref >59)
GFR, EST NON AFRICAN AMERICAN: 33 mL/min/{1.73_m2} — AB (ref >59)
GLUCOSE: 190 mg/dL — AB (ref 65–99)
Potassium: 4.6 mmol/L (ref 3.5–5.2)
Sodium: 137 mmol/L (ref 134–144)

## 2016-12-01 ENCOUNTER — Other Ambulatory Visit: Payer: Self-pay

## 2016-12-01 MED ORDER — TORSEMIDE 20 MG PO TABS: ORAL_TABLET | ORAL | 3 refills | Status: DC

## 2016-12-02 ENCOUNTER — Other Ambulatory Visit: Payer: Self-pay | Admitting: Primary Care

## 2016-12-02 DIAGNOSIS — E119 Type 2 diabetes mellitus without complications: Secondary | ICD-10-CM

## 2016-12-03 ENCOUNTER — Ambulatory Visit (INDEPENDENT_AMBULATORY_CARE_PROVIDER_SITE_OTHER): Payer: Medicare Other | Admitting: Internal Medicine

## 2016-12-03 ENCOUNTER — Encounter: Payer: Self-pay | Admitting: Internal Medicine

## 2016-12-03 ENCOUNTER — Other Ambulatory Visit
Admission: RE | Admit: 2016-12-03 | Discharge: 2016-12-03 | Disposition: A | Discharge status: Home / Self Care | Payer: Medicare Other | Source: Ambulatory Visit | Attending: Internal Medicine | Admitting: Internal Medicine

## 2016-12-03 VITALS — BP 120/70 | HR 63 | Ht 63.0 in | Wt 148.5 lb

## 2016-12-03 DIAGNOSIS — I5022 Chronic systolic (congestive) heart failure: Secondary | ICD-10-CM | POA: Diagnosis not present

## 2016-12-03 DIAGNOSIS — Z01818 Encounter for other preprocedural examination: Secondary | ICD-10-CM

## 2016-12-03 DIAGNOSIS — I255 Ischemic cardiomyopathy: Secondary | ICD-10-CM | POA: Insufficient documentation

## 2016-12-03 DIAGNOSIS — E1159 Type 2 diabetes mellitus with other circulatory complications: Secondary | ICD-10-CM

## 2016-12-03 DIAGNOSIS — I2510 Atherosclerotic heart disease of native coronary artery without angina pectoris: Secondary | ICD-10-CM | POA: Diagnosis not present

## 2016-12-03 DIAGNOSIS — E785 Hyperlipidemia, unspecified: Secondary | ICD-10-CM | POA: Diagnosis not present

## 2016-12-03 DIAGNOSIS — E119 Type 2 diabetes mellitus without complications: Secondary | ICD-10-CM | POA: Diagnosis present

## 2016-12-03 DIAGNOSIS — I10 Essential (primary) hypertension: Secondary | ICD-10-CM

## 2016-12-03 LAB — CBC WITH DIFFERENTIAL/PLATELET
BASOS ABS: 0 10*3/uL (ref 0–0.1)
BASOS PCT: 1 %
EOS PCT: 3 %
Eosinophils Absolute: 0.1 10*3/uL (ref 0–0.7)
HCT: 34 % — ABNORMAL LOW (ref 35.0–47.0)
Hemoglobin: 11.1 g/dL — ABNORMAL LOW (ref 12.0–16.0)
LYMPHS PCT: 35 %
Lymphs Abs: 1.4 10*3/uL (ref 1.0–3.6)
MCH: 27.3 pg (ref 26.0–34.0)
MCHC: 32.7 g/dL (ref 32.0–36.0)
MCV: 83.6 fL (ref 80.0–100.0)
MONO ABS: 0.4 10*3/uL (ref 0.2–0.9)
Monocytes Relative: 9 %
Neutro Abs: 2.1 10*3/uL (ref 1.4–6.5)
Neutrophils Relative %: 52 %
PLATELETS: 192 10*3/uL (ref 150–440)
RBC: 4.07 MIL/uL (ref 3.80–5.20)
RDW: 14.1 % (ref 11.5–14.5)
WBC: 4 10*3/uL (ref 3.6–11.0)

## 2016-12-03 LAB — PROTIME-INR
INR: 1.12
Prothrombin Time: 14.5 seconds (ref 11.4–15.2)

## 2016-12-03 LAB — BASIC METABOLIC PANEL
Anion gap: 9 (ref 5–15)
BUN: 26 mg/dL — AB (ref 6–20)
CALCIUM: 9.2 mg/dL (ref 8.9–10.3)
CO2: 24 mmol/L (ref 22–32)
Chloride: 100 mmol/L — ABNORMAL LOW (ref 101–111)
Creatinine, Ser: 1.56 mg/dL — ABNORMAL HIGH (ref 0.44–1.00)
GFR calc Af Amer: 37 mL/min — ABNORMAL LOW (ref >60)
GFR, EST NON AFRICAN AMERICAN: 32 mL/min — AB (ref >60)
GLUCOSE: 273 mg/dL — AB (ref 65–99)
Potassium: 4.5 mmol/L (ref 3.5–5.1)
Sodium: 133 mmol/L — ABNORMAL LOW (ref 135–145)

## 2016-12-03 NOTE — Patient Instructions (Signed)
Medication Instructions:  Your physician recommends that you continue on your current medications as directed. Please refer to the Current Medication list given to you today.   Labwork: Your physician recommends that you return for lab work in: TODAY (CBC, BMP, PT/INR, Hgb A1c) - Please go to the [facility]. You will check in at the front desk to the right as you walk into the atrium. Valet Parking is offered if needed.     Testing/Procedures:  You are scheduled for a LEFT HEART CATHETERIZATION on 12/05/16 with Dr. Cristal Deer END.  1. Please arrive at the [facility] (Main Entrance A) at [facility]: [address] [place], [place] [address] at 06:30 AM (4 hours before your procedure to ensure your preparation). Free valet parking service is available.   Special note: Every effort is made to have your procedure done on time. Please understand that emergencies sometimes delay scheduled procedures.  2. Diet: Do not eat or drink anything after midnight prior to your procedure except sips of water to take medications.  3. Labs: TODAY AT MEDICAL MALL  4. Medication instructions in preparation for your procedure:  DO NOT TAKE Torsemide, Glipizide, Enalapril the morning of the procedure.  TAKE 1/2 YOUR DOSE of Lantus the night before the procedure.  On the morning of your procedure, take your OTHER morning medicines NOT listed above.  You may use sips of water.  5. Plan for one night stay--bring personal belongings.  6. Bring a current list of your medications and current insurance cards.  7. You MUST have a responsible person to drive you home.  8. Someone MUST be with you the first 24 hours after you arrive home or your discharge will be delayed.  9. Please wear clothes that are easy to get on and off and wear slip-on shoes.  Thank you for allowing [place] to care for you!   -- [facility]      Follow-Up: You will be  given instruction for follow-up appointment upon leaving the hospital.   If you need a refill on your cardiac medications before your next appointment, please call your pharmacy.    Angiogram An angiogram, also called angiography, is a procedure used to look at the blood vessels. In this procedure, dye is injected through a long, thin tube (catheter) into an artery. X-rays are then taken. The X-rays will show if there is a blockage or problem in a blood vessel. Tell a health care provider about:  Any allergies you have, including allergies to shellfish or contrast dye.  All medicines you are taking, including vitamins, herbs, eye drops, creams, and over-the-counter medicines.  Any problems you or family members have had with anesthetic medicines.  Any blood disorders you have.  Any surgeries you have had.  Any previous kidney problems or failure you have had.  Any medical conditions you have.  Possibility of pregnancy, if this applies. What are the risks? Generally, an angiogram is a safe procedure. However, as with any procedure, problems can occur. Possible problems include:  Injury to the blood vessels, including rupture or bleeding.  Infection or bruising at the catheter site.  Allergic reaction to the dye or contrast used.  Kidney damage from the dye or contrast used.  Blood clots that can lead to a stroke or heart attack. What happens before the procedure?  Do not eat or drink after midnight on the night before the procedure, or as directed by your health care  provider.  Ask your health care provider if you may drink enough water to take any needed medicines the morning of the procedure. What happens during the procedure?  You may be given a medicine to help you relax (sedative) before and during the procedure. This medicine is given through an IV access tube that is inserted into one of your veins.  The area where the catheter will be inserted will be washed and  shaved. This is usually done in the groin but may be done in the fold of your arm (near your elbow) or in the wrist.  A medicine will be given to numb the area where the catheter will be inserted (local anesthetic).  The catheter will be inserted with a guide wire into an artery. The catheter is guided by using a type of X-ray (fluoroscopy) to the blood vessel being examined.  Dye is then injected into the catheter, and X-rays are taken. The dye helps to show where any narrowing or blockages are located. What happens after the procedure?  If the procedure is done through the leg, you will be kept in bed lying flat for several hours. You will be instructed to not bend or cross your legs.  The insertion site will be checked frequently.  The pulse in your feet or wrist will be checked frequently.  Additional blood tests, X-rays, and electrocardiography may be done.  You may need to stay in the hospital overnight for observation. This information is not intended to replace advice given to you by your health care provider. Make sure you discuss any questions you have with your health care provider. Document Released: 04/23/2005 Document Revised: 12/26/2015 Document Reviewed: 12/15/2012 Elsevier Interactive Patient Education  2017 [employer]    Coronary Angiogram With Stent Coronary angiogram with stent placement is a procedure to widen or open a narrow blood vessel of the heart (coronary artery). Arteries may become blocked by cholesterol buildup (plaques) in the lining or wall. When a coronary artery becomes partially blocked, blood flow to that area decreases. This may lead to chest pain or a heart attack (myocardial infarction). A stent is a small piece of metal that looks like mesh or a spring. Stent placement may be done as treatment for a heart attack or right after a coronary angiogram in which a blocked artery is found. Let your health care provider know about:  Any allergies you  have.  All medicines you are taking, including vitamins, herbs, eye drops, creams, and over-the-counter medicines.  Any problems you or family members have had with anesthetic medicines.  Any blood disorders you have.  Any surgeries you have had.  Any medical conditions you have.  Whether you are pregnant or may be pregnant. What are the risks? Generally, this is a safe procedure. However, problems may occur, including:  Damage to the heart or its blood vessels.  A return of blockage.  Bleeding, infection, or bruising at the insertion site.  A collection of blood under the skin (hematoma) at the insertion site.  A blood clot in another part of the body.  Kidney injury.  Allergic reaction to the dye or contrast that is used.  Bleeding into the abdomen (retroperitoneal bleeding). What happens before the procedure? Staying hydrated  Follow instructions from your health care provider about hydration, which may include:  Up to 2 hours before the procedure - you may continue to drink clear liquids, such as water, clear fruit juice, black coffee, and plain tea. Eating and  drinking restrictions  Follow instructions from your health care provider about eating and drinking, which may include:  8 hours before the procedure - stop eating heavy meals or foods such as meat, fried foods, or fatty foods.  6 hours before the procedure - stop eating light meals or foods, such as toast or cereal.  2 hours before the procedure - stop drinking clear liquids. Ask your health care provider about:  Changing or stopping your regular medicines. This is especially important if you are taking diabetes medicines or blood thinners.  Taking medicines such as ibuprofen. These medicines can thin your blood. Do not take these medicines before your procedure if your health care provider instructs you not to. Generally, aspirin is recommended before a procedure of passing a small, thin tube (catheter)  through a blood vessel and into the heart (cardiac catheterization). What happens during the procedure?  An IV tube will be inserted into one of your veins.  You will be given one or more of the following:  A medicine to help you relax (sedative).  A medicine to numb the area where the catheter will be inserted into an artery (local anesthetic).  To reduce your risk of infection:  Your health care team will wash or sanitize their hands.  Your skin will be washed with soap.  Hair may be removed from the area where the catheter will be inserted.  Using a guide wire, the catheter will be inserted into an artery. The location may be in your groin, in your wrist, or in the fold of your arm (near your elbow).  A type of X-ray (fluoroscopy) will be used to help guide the catheter to the opening of the arteries in the heart.  A dye will be injected into the catheter, and X-rays will be taken. The dye will help to show where any narrowing or blockages are located in the arteries.  A tiny wire will be guided to the blocked spot, and a balloon will be inflated to make the artery wider.  The stent will be expanded and will crush the plaques into the wall of the vessel. The stent will hold the area open and improve the blood flow. Most stents have a drug coating to reduce the risk of the stent narrowing over time.  The artery may be made wider using a drill, laser, or other tools to remove plaques.  When the blood flow is better, the catheter will be removed. The lining of the artery will grow over the stent, which stays where it was placed. This procedure may vary among health care providers and hospitals. What happens after the procedure?  If the procedure is done through the leg, you will be kept in bed lying flat for about 6 hours. You will be instructed to not bend and not cross your legs.  The insertion site will be checked frequently.  The pulse in your foot or wrist will be checked  frequently.  You may have additional blood tests, X-rays, and a test that records the electrical activity of your heart (electrocardiogram, or ECG). This information is not intended to replace advice given to you by your health care provider. Make sure you discuss any questions you have with your health care provider. Document Released: 01/18/2003 Document Revised: 03/13/2016 Document Reviewed: 02/17/2016 Elsevier Interactive Patient Education  2017 [employer].

## 2016-12-03 NOTE — Progress Notes (Signed)
 Follow-up Outpatient Visit Date: 12/03/2016  Primary Care Provider: Howard, Katherine K, NP [address] [place] [address]  Chief Complaint: Follow-up heart failure  HPI:  Megan Howard is a 70 y.o. year-old female with history of coronary artery disease status post CABG in 12/2015 in [place], ischemic cardiomyopathy, postoperative atrial fibrillation, type 2 diabetes mellitus, chronic kidney disease stage III, and GERD, who presents for follow-up of chronic systolic heart failure. She underwent CABG in 12/2015; her postoperative course was complicated by rehospitalization and a lengthy stay in rehab.  She moved to [place] to be with her daughter last fall.  I first met her in late November, 2017, at which time she was markedly volume overloaded.  Echocardiogram performed after our first visit revealed biventricular dysfunction with LVEF of 30-35%, which was unchanged on follow-up echo. With aggressive diuresis and optimization of evidence-based heart failure therapy, we were able to improve her symptoms dramatically. However, she travelled back to [place] in April and returned with weight gain and edema. With further diuresis, acute kidney injury occurred. We temporarily held diuresis, as well as enalapril and spironolactone. He renal function improved but Megan Howard subsequent developed heart failure symptoms leading to an ED visit on 11/25/16. She was subsequently seen in our office by Ryan Dunn on 11/26/16, at which time furosemide was switched to torsemide due to continued dyspnea and abdominal swelling. Alternating daily doses of torsemide 40 mg and 20 mg were ultimately used to balance diuresis and renal insufficiency.  Today, Megan Howard reports that she is back to her baseline in regard to breathing. She has been able to climb stairs without difficulty. She also denies orthopnea, PND and edema. Her weight remains above her previous dry weight of 143 pounds, though she was never able to get back  to this weight after returning from [place] despite escalating doses of furosemide. She has not had any chest pain, palpitations, or lightheadedness. She remains compliant with her medications.  --------------------------------------------------------------------------------------------------   Cardiovascular History & Procedures: Cardiovascular Problems:  Coronary artery disease  Ischemic cardiomyopathy  Postoperative atrial fibrillation  Risk Factors:  Known coronary artery disease, diabetes mellitus, family history, and age greater than 65  Cath/PCI:  LHC (12/29/15, [facility]): LMCA with mild luminal irregularities. LAD with mild proximal disease and 99% apical stenosis. Codominant LCx with 80% mid vessel stenosis. Small, codominant RCA with 99% mid and 75-80% distal stenoses. Unsuccessful PCI to mid RCA due to inability to expand stenosis and deploy stent. 80% and 90% residual stenoses remain. LVEDP 21 mmHg. LVEF 45% with inferior hypokinesis.  CV Surgery:  CABG (12/2015 in [place]): SVG to OM2 and SVG to RCA.  EP Procedures and Devices:  Holter monitor (2017): Reportedly no evidence of atrial fibrillation.  Non-Invasive Evaluation(s):  Limited TTE (08/19/16): Normal LV size with mild LVH.  LVEF 30-35%.  MAC with mild MR present.  LA moderately dilated.  RA mildly dilated.  Mild TR.  Mild-to moderate pulmonary hypertension (RVSP 42 mmHg).  TTE (07/03/16): Mildly dilated LV with severely reduced contraction (EF 30-35%). There is diffuse hypokinesis with severe hypokinesis of the anterior and anteroseptal walls. 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 There is mild left atrial enlargement. RV size is moderately enlarged with moderately reduced function. There is moderate TR. Moderate pulmonary hypertension also evident.  TTE (04/20/16): Mild LVH with LVEF 40% and moderate global hypokinesis. Restrictive filling pattern noted. Decreased RV contraction with  evidence of RV volume overload. Aortic sclerosis.   Mild mitral and moderate tricuspid regurgitation. Moderate to severe pulmonary hypertension.  TTE (02/13/16): Normal LV size and function with LVEF of 67%. Mild LVH present. 2+ tricuspid and 1+ pulmonic regurgitation present. Mild pulmonary hypertension.  Recent CV Pertinent Labs: Lab Results  Component Value Date   CHOL 135 08/01/2016   HDL 25 (L) 08/01/2016   LDLCALC 81 08/01/2016   TRIG 147 08/01/2016   CHOLHDL 5.4 (H) 08/01/2016   INR 1.12 12/03/2016   BNP >4,500.0 (H) 06/25/2016   K 4.5 12/03/2016   BUN 26 (H) 12/03/2016   BUN 24 11/28/2016   CREATININE 1.56 (H) 12/03/2016    Past medical and surgical history were reviewed and updated in EPIC.  Outpatient Encounter Prescriptions as of 12/03/2016  Medication Sig  . aspirin EC 81 MG tablet Take 81 mg by mouth daily.  Marland Kitchen atorvastatin (LIPITOR) 20 MG tablet Take 1 tablet (20 mg total) by mouth daily.  . carvedilol (COREG) 12.5 MG tablet Take 1 tablet (12.5 mg total) by mouth 2 (two) times daily with a meal.  . enalapril (VASOTEC) 20 MG tablet Take 1 tablet (20 mg total) by mouth 2 (two) times daily.  Marland Kitchen escitalopram (LEXAPRO) 20 MG tablet Take 1 tablet (20 mg total) by mouth daily.  Marland Kitchen glipiZIDE (GLUCOTROL) 5 MG tablet Take 1 tablet (5 mg total) by mouth 2 (two) times daily before a meal. For diabetes  . hydrALAZINE (APRESOLINE) 25 MG tablet Take 1 tablet (25 mg total) by mouth 2 (two) times daily.  . insulin glargine (LANTUS) 100 UNIT/ML injection Inject 10 Units into the skin at bedtime.  . isosorbide dinitrate (ISORDIL) 20 MG tablet Take 1 tablet (20 mg total) by mouth 3 (three) times daily.  Marland Kitchen latanoprost (XALATAN) 0.005 % ophthalmic solution Place 1 drop into both eyes at bedtime.  . Magnesium Oxide 400 MG CAPS Take 1 capsule (400 mg total) by mouth daily.  . pantoprazole (PROTONIX) 40 MG tablet Take 1 tablet (40 mg total) by mouth daily.  . Potassium Chloride ER 20 MEQ TBCR Take 2  tablets (40 mEq) by mouth once a day.  . torsemide (DEMADEX) 20 MG tablet Take 83m one time a day, alternating with 236mthe alternate days.   No facility-administered encounter medications on file as of 12/03/2016.     Allergies: Shellfish allergy  Social History   Social History  . Marital status: Single    Spouse name: N/A  . Number of children: N/A  . Years of education: N/A   Occupational History  . Not on file.   Social History Main Topics  . Smoking status: Never Smoker  . Smokeless tobacco: Never Used  . Alcohol use No  . Drug use: No  . Sexual activity: Not on file   Other Topics Concern  . Not on file   Social History Narrative   Single.   [place]  Now lives with daughter.     Family History  Problem Relation Age of Onset  . Heart attack Sister   . Arrhythmia Brother     s/p AICD  . Cardiomyopathy Brother   . Diabetes type II Brother     Review of Systems: A 12-system review of systems was performed and was negative except as noted in the HPI.  --------------------------------------------------------------------------------------------------  Physical Exam: BP 120/70 (BP Location: Left Arm, Patient Position: Sitting, Cuff Size: Normal)   Pulse 63   Ht _0  (1.6 m)   Wt 148 lb 8 oz (  67.4 kg)   BMI 26.31 kg/m   General:  Well-developed, well-nourished elderly woman, seated comfortably in the exam room. She is again accompanied by her daughter. HEENT: No conjunctival pallor or scleral icterus.  Moist mucous membranes.  OP clear. Neck: Supple without lymphadenopathy, thyromegaly, JVD, or HJR. Lungs: Normal work of breathing.  Clear to auscultation bilaterally without wheezes or crackles. Heart: Regular rate and rhythm with split S2. No murmurs, rubs, or gallops.  Non-displaced PMI. Abd: Bowel sounds present.  Soft, NT/ND without hepatosplenomegaly Ext: No lower extremity edema.  Radial, PT, and DP pulses are 2+ bilaterally. Skin:  warm and dry without rash  EKG:  NSR with LBBB.  Lab Results  Component Value Date   WBC 4.0 12/03/2016   HGB 11.1 (L) 12/03/2016   HCT 34.0 (L) 12/03/2016   MCV 83.6 12/03/2016   PLT 192 12/03/2016    Lab Results  Component Value Date   NA 133 (L) 12/03/2016   K 4.5 12/03/2016   CL 100 (L) 12/03/2016   CO2 24 12/03/2016   BUN 26 (H) 12/03/2016   CREATININE 1.56 (H) 12/03/2016   GLUCOSE 273 (H) 12/03/2016   ALT 14 11/24/2016    Lab Results  Component Value Date   CHOL 135 08/01/2016   HDL 25 (L) 08/01/2016   LDLCALC 81 08/01/2016   TRIG 147 08/01/2016   CHOLHDL 5.4 (H) 08/01/2016    --------------------------------------------------------------------------------------------------  ASSESSMENT AND PLAN: Chronic systolic heart failure secondary to ischemic cardiomyopathy Megan Howard appears euvolemic on exam today, though she remains about 5 pounds above her previous dry weight. I suspect that she may have also gained non-fluid weight during her month in [place]. She is tolerating her current medication regimen well, though spironolactone is still being held. We will not make any medication changes today. Given progressive decline in LVEF since last summer following CABG, we have again discussed ischemia evaluation. We will proceed with left heart catheterization with possible PCI in 2 days at [facility]. I have reviewed the risks, indications, and alternatives to cardiac catheterization, possible angioplasty, and stenting with the patient. Risks include but are not limited to bleeding, infection, vascular injury, stroke, myocardial infection, arrhythmia, kidney injury, radiation-related injury in the case of prolonged fluoroscopy use, emergency cardiac surgery, and death. The patient understands the risks of serious complication is 1-2 in 1000 with diagnostic cardiac cath and 1-2% or less with angioplasty/stenting. I will have her come in early the same day for gentle  prehydration, given chronic kidney disease.  Coronary artery disease without angina Megan Howard does not have any symptoms to suggest worseing coronary insufficiency. However, as above remain concerned about progrssion of her CAD or graft failure, given severe LV dysfunction following CABG last summer. We will plan for LHC later this week.  Hypertension Blood pressure is well-controlled today. No medication changes.  Hyperlipidemia LDL was 81 in 07/2016 (goal < 70). We plan to recheck a fasting lipid panel when the patient presents for cardiac catheterization on Friday and titrate up atorvastatin as tolerated to achieve goal LDL.  Diabetes mellitus At the request of Megan Howard' PCP, we will check hemoglobin A1c today. I defer management of diabetes to Megan Howard.  Follow-up: Return to clinic based on results of cardiac catheterization.  Megan Marten, MD 12/03/2016 8:05 PM  

## 2016-12-04 LAB — HEMOGLOBIN A1C
HEMOGLOBIN A1C: 7.5 % — AB (ref 4.8–5.6)
Mean Plasma Glucose: 169 mg/dL

## 2016-12-05 ENCOUNTER — Ambulatory Visit (HOSPITAL_COMMUNITY)
Admission: RE | Admit: 2016-12-05 | Discharge: 2016-12-05 | Disposition: A | Discharge status: Home / Self Care | Payer: Medicare Other | Source: Ambulatory Visit | Attending: Internal Medicine | Admitting: Internal Medicine

## 2016-12-05 ENCOUNTER — Ambulatory Visit (HOSPITAL_COMMUNITY)
Admission: RE | Disposition: A | Discharge status: Home / Self Care | Payer: Self-pay | Source: Ambulatory Visit | Attending: Internal Medicine

## 2016-12-05 ENCOUNTER — Other Ambulatory Visit: Payer: Self-pay | Admitting: Primary Care

## 2016-12-05 DIAGNOSIS — I130 Hypertensive heart and chronic kidney disease with heart failure and stage 1 through stage 4 chronic kidney disease, or unspecified chronic kidney disease: Secondary | ICD-10-CM | POA: Diagnosis not present

## 2016-12-05 DIAGNOSIS — I255 Ischemic cardiomyopathy: Secondary | ICD-10-CM | POA: Diagnosis not present

## 2016-12-05 DIAGNOSIS — K219 Gastro-esophageal reflux disease without esophagitis: Secondary | ICD-10-CM | POA: Diagnosis not present

## 2016-12-05 DIAGNOSIS — E785 Hyperlipidemia, unspecified: Secondary | ICD-10-CM | POA: Diagnosis not present

## 2016-12-05 DIAGNOSIS — E1122 Type 2 diabetes mellitus with diabetic chronic kidney disease: Secondary | ICD-10-CM | POA: Diagnosis not present

## 2016-12-05 DIAGNOSIS — I4891 Unspecified atrial fibrillation: Secondary | ICD-10-CM | POA: Diagnosis not present

## 2016-12-05 DIAGNOSIS — E118 Type 2 diabetes mellitus with unspecified complications: Secondary | ICD-10-CM

## 2016-12-05 DIAGNOSIS — Z951 Presence of aortocoronary bypass graft: Secondary | ICD-10-CM | POA: Insufficient documentation

## 2016-12-05 DIAGNOSIS — I2510 Atherosclerotic heart disease of native coronary artery without angina pectoris: Secondary | ICD-10-CM | POA: Diagnosis not present

## 2016-12-05 DIAGNOSIS — Z794 Long term (current) use of insulin: Secondary | ICD-10-CM | POA: Diagnosis not present

## 2016-12-05 DIAGNOSIS — N183 Chronic kidney disease, stage 3 (moderate): Secondary | ICD-10-CM | POA: Insufficient documentation

## 2016-12-05 DIAGNOSIS — Z7982 Long term (current) use of aspirin: Secondary | ICD-10-CM | POA: Insufficient documentation

## 2016-12-05 DIAGNOSIS — I272 Pulmonary hypertension, unspecified: Secondary | ICD-10-CM | POA: Insufficient documentation

## 2016-12-05 DIAGNOSIS — I2582 Chronic total occlusion of coronary artery: Secondary | ICD-10-CM | POA: Insufficient documentation

## 2016-12-05 DIAGNOSIS — I5022 Chronic systolic (congestive) heart failure: Secondary | ICD-10-CM | POA: Diagnosis not present

## 2016-12-05 HISTORY — PX: BYPASS GRAFT ANGIOGRAPHY: CATH118229

## 2016-12-05 HISTORY — PX: LEFT HEART CATH AND CORONARY ANGIOGRAPHY: CATH118249

## 2016-12-05 LAB — LIPID PANEL
Cholesterol: 105 mg/dL (ref 0–200)
HDL: 28 mg/dL — AB (ref >40)
LDL CALC: 56 mg/dL (ref 0–99)
TRIGLYCERIDES: 103 mg/dL (ref <150)
Total CHOL/HDL Ratio: 3.8 RATIO
VLDL: 21 mg/dL (ref 0–40)

## 2016-12-05 LAB — GLUCOSE, CAPILLARY
GLUCOSE-CAPILLARY: 117 mg/dL — AB (ref 65–99)
Glucose-Capillary: 153 mg/dL — ABNORMAL HIGH (ref 65–99)

## 2016-12-05 SURGERY — LEFT HEART CATH AND CORONARY ANGIOGRAPHY
Anesthesia: LOCAL

## 2016-12-05 MED ORDER — SODIUM CHLORIDE 0.9% FLUSH: 3.0000 mL | INTRAVENOUS | Status: DC | PRN

## 2016-12-05 MED ORDER — MIDAZOLAM HCL 2 MG/2ML IJ SOLN
INTRAMUSCULAR | Status: DC | PRN
  Administered 2016-12-05: 1 mg via INTRAVENOUS

## 2016-12-05 MED ORDER — SODIUM CHLORIDE 0.9 % IV SOLN: 250.0000 mL | INTRAVENOUS | Status: DC | PRN

## 2016-12-05 MED ORDER — SODIUM CHLORIDE 0.9 % IV SOLN
INTRAVENOUS | Status: DC
  Administered 2016-12-05: 08:00:00 via INTRAVENOUS

## 2016-12-05 MED ORDER — VERAPAMIL HCL 2.5 MG/ML IV SOLN
INTRAVENOUS | Status: DC | PRN
  Administered 2016-12-05: 10 mL via INTRA_ARTERIAL

## 2016-12-05 MED ORDER — VERAPAMIL HCL 2.5 MG/ML IV SOLN
INTRAVENOUS | Status: AC
  Filled 2016-12-05: qty 2

## 2016-12-05 MED ORDER — HEPARIN SODIUM (PORCINE) 1000 UNIT/ML IJ SOLN
INTRAMUSCULAR | Status: DC | PRN
  Administered 2016-12-05: 4000 [IU] via INTRAVENOUS

## 2016-12-05 MED ORDER — SODIUM CHLORIDE 0.9 % IV SOLN: INTRAVENOUS | Status: DC

## 2016-12-05 MED ORDER — HEPARIN SODIUM (PORCINE) 1000 UNIT/ML IJ SOLN
INTRAMUSCULAR | Status: AC
  Filled 2016-12-05: qty 1

## 2016-12-05 MED ORDER — HEPARIN (PORCINE) IN NACL 2-0.9 UNIT/ML-% IJ SOLN
INTRAMUSCULAR | Status: AC
  Filled 2016-12-05: qty 1000

## 2016-12-05 MED ORDER — LIDOCAINE HCL 1 % IJ SOLN
INTRAMUSCULAR | Status: AC
  Filled 2016-12-05: qty 20

## 2016-12-05 MED ORDER — FENTANYL CITRATE (PF) 100 MCG/2ML IJ SOLN
INTRAMUSCULAR | Status: AC
  Filled 2016-12-05: qty 2

## 2016-12-05 MED ORDER — FENTANYL CITRATE (PF) 100 MCG/2ML IJ SOLN
INTRAMUSCULAR | Status: DC | PRN
  Administered 2016-12-05: 25 ug via INTRAVENOUS

## 2016-12-05 MED ORDER — MIDAZOLAM HCL 2 MG/2ML IJ SOLN
INTRAMUSCULAR | Status: AC
  Filled 2016-12-05: qty 2

## 2016-12-05 MED ORDER — IOPAMIDOL (ISOVUE-370) INJECTION 76%
INTRAVENOUS | Status: AC
  Filled 2016-12-05: qty 100

## 2016-12-05 MED ORDER — SODIUM CHLORIDE 0.9% FLUSH: 3.0000 mL | Freq: Two times a day (BID) | INTRAVENOUS | Status: DC

## 2016-12-05 MED ORDER — IOPAMIDOL (ISOVUE-370) INJECTION 76%
INTRAVENOUS | Status: DC | PRN
  Administered 2016-12-05: 45 mL via INTRA_ARTERIAL

## 2016-12-05 MED ORDER — ASPIRIN 81 MG PO CHEW: 81.0000 mg | CHEWABLE_TABLET | ORAL | Status: DC

## 2016-12-05 MED ORDER — GLIPIZIDE ER 2.5 MG PO TB24: 2.5000 mg | ORAL_TABLET | Freq: Every day | ORAL | 1 refills | Status: DC

## 2016-12-05 MED ORDER — LIDOCAINE HCL (PF) 1 % IJ SOLN
INTRAMUSCULAR | Status: DC | PRN
  Administered 2016-12-05: 2 mL via INTRADERMAL

## 2016-12-05 SURGICAL SUPPLY — 12 items
CATH INFINITI 5 FR MPA2 (CATHETERS) ×2 IMPLANT
CATH INFINITI 5FR AL1 (CATHETERS) ×2 IMPLANT
CATH INFINITI 5FR MULTPACK ANG (CATHETERS) ×2 IMPLANT
DEVICE RAD COMP TR BAND LRG (VASCULAR PRODUCTS) ×2 IMPLANT
GLIDESHEATH SLEND SS 6F .021 (SHEATH) ×2 IMPLANT
GUIDEWIRE INQWIRE 1.5J.035X260 (WIRE) ×1 IMPLANT
INQWIRE 1.5J .035X260CM (WIRE) ×2
KIT HEART LEFT (KITS) ×2 IMPLANT
PACK CARDIAC CATHETERIZATION (CUSTOM PROCEDURE TRAY) ×2 IMPLANT
TRANSDUCER W/STOPCOCK (MISCELLANEOUS) ×2 IMPLANT
TUBING CIL FLEX 10 FLL-RA (TUBING) ×2 IMPLANT
WIRE HI TORQ VERSACORE-J 145CM (WIRE) ×2 IMPLANT

## 2016-12-05 NOTE — H&P (View-Only) (Signed)
Follow-up Outpatient Visit Date: 12/03/2016  Primary Care Provider: Pleas Koch, NP [place] [address]  Chief Complaint: Follow-up heart failure  HPI:  Megan Howard is a 71 y.o. year-old female with history of coronary artery disease status post CABG in 12/2015 in [place], ischemic cardiomyopathy, postoperative atrial fibrillation, type 2 diabetes mellitus, chronic kidney disease stage III, and GERD, who presents for follow-up of chronic systolic heart failure. She underwent CABG in 12/2015; her postoperative course was complicated by rehospitalization and a lengthy stay in rehab.  She moved to [place] to be with her daughter last fall.  I first met her in late November, 2017, at which time she was markedly volume overloaded.  Echocardiogram performed after our first visit revealed biventricular dysfunction with LVEF of 30-35%, which was unchanged on follow-up echo. With aggressive diuresis and optimization of evidence-based heart failure therapy, we were able to improve her symptoms dramatically. However, she travelled back to [place] in April and returned with weight gain and edema. With further diuresis, acute kidney injury occurred. We temporarily held diuresis, as well as enalapril and spironolactone. He renal function improved but [name] subsequent developed heart failure symptoms leading to an ED visit on 11/25/16. She was subsequently seen in our office by Christell Faith on 11/26/16, at which time furosemide was switched to torsemide due to continued dyspnea and abdominal swelling. Alternating daily doses of torsemide 40 mg and 20 mg were ultimately used to balance diuresis and renal insufficiency.  Today, [name] reports that she is back to her baseline in regard to breathing. She has been able to climb stairs without difficulty. She also denies orthopnea, PND and edema. Her weight remains above her previous dry weight of 143 pounds, though she was never able to get back  to this weight after returning from [place] despite escalating doses of furosemide. She has not had any chest pain, palpitations, or lightheadedness. She remains compliant with her medications.  --------------------------------------------------------------------------------------------------   Cardiovascular History & Procedures: Cardiovascular Problems:  Coronary artery disease  Ischemic cardiomyopathy  Postoperative atrial fibrillation  Risk Factors:  Known coronary artery disease, diabetes mellitus, family history, and age greater than 55  Cath/PCI:  LHC (12/29/15, [facility]): LMCA with mild luminal irregularities. LAD with mild proximal disease and 99% apical stenosis. Codominant LCx with 80% mid vessel stenosis. Small, codominant RCA with 99% mid and 75-80% distal stenoses. Unsuccessful PCI to mid RCA due to inability to expand stenosis and deploy stent. 80% and 90% residual stenoses remain. LVEDP 21 mmHg. LVEF 45% with inferior hypokinesis.  CV Surgery:  CABG (12/2015 in [place]): SVG to OM2 and SVG to RCA.  EP Procedures and Devices:  Holter monitor (2017): Reportedly no evidence of atrial fibrillation.  Non-Invasive Evaluation(s):  Limited TTE (08/19/16): Normal LV size with mild LVH.  LVEF 30-35%.  MAC with mild MR present.  LA moderately dilated.  RA mildly dilated.  Mild TR.  Mild-to moderate pulmonary hypertension (RVSP 42 mmHg).  TTE (07/03/16): Mildly dilated LV with severely reduced contraction (EF 30-35%). There is diffuse hypokinesis with severe hypokinesis of the anterior and anteroseptal walls. 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 There is mild left atrial enlargement. RV size is moderately enlarged with moderately reduced function. There is moderate TR. Moderate pulmonary hypertension also evident.  TTE (04/20/16): Mild LVH with LVEF 40% and moderate global hypokinesis. Restrictive filling pattern noted. Decreased RV contraction with  evidence of RV volume overload. Aortic sclerosis.  Mild mitral and moderate tricuspid regurgitation. Moderate to severe pulmonary hypertension.  TTE (02/13/16): Normal LV size and function with LVEF of 67%. Mild LVH present. 2+ tricuspid and 1+ pulmonic regurgitation present. Mild pulmonary hypertension.  Recent CV Pertinent Labs: Lab Results  Component Value Date   CHOL 135 08/01/2016   HDL 25 (L) 08/01/2016   LDLCALC 81 08/01/2016   TRIG 147 08/01/2016   CHOLHDL 5.4 (H) 08/01/2016   INR 1.12 12/03/2016   BNP >4,500.0 (H) 06/25/2016   K 4.5 12/03/2016   BUN 26 (H) 12/03/2016   BUN 24 11/28/2016   CREATININE 1.56 (H) 12/03/2016    Past medical and surgical history were reviewed and updated in EPIC.  Outpatient Encounter Prescriptions as of 12/03/2016  Medication Sig  . aspirin EC 81 MG tablet Take 81 mg by mouth daily.  Marland Kitchen atorvastatin (LIPITOR) 20 MG tablet Take 1 tablet (20 mg total) by mouth daily.  . carvedilol (COREG) 12.5 MG tablet Take 1 tablet (12.5 mg total) by mouth 2 (two) times daily with a meal.  . enalapril (VASOTEC) 20 MG tablet Take 1 tablet (20 mg total) by mouth 2 (two) times daily.  Marland Kitchen escitalopram (LEXAPRO) 20 MG tablet Take 1 tablet (20 mg total) by mouth daily.  Marland Kitchen glipiZIDE (GLUCOTROL) 5 MG tablet Take 1 tablet (5 mg total) by mouth 2 (two) times daily before a meal. For diabetes  . hydrALAZINE (APRESOLINE) 25 MG tablet Take 1 tablet (25 mg total) by mouth 2 (two) times daily.  . insulin glargine (LANTUS) 100 UNIT/ML injection Inject 10 Units into the skin at bedtime.  . isosorbide dinitrate (ISORDIL) 20 MG tablet Take 1 tablet (20 mg total) by mouth 3 (three) times daily.  Marland Kitchen latanoprost (XALATAN) 0.005 % ophthalmic solution Place 1 drop into both eyes at bedtime.  . Magnesium Oxide 400 MG CAPS Take 1 capsule (400 mg total) by mouth daily.  . pantoprazole (PROTONIX) 40 MG tablet Take 1 tablet (40 mg total) by mouth daily.  . Potassium Chloride ER 20 MEQ TBCR Take 2  tablets (40 mEq) by mouth once a day.  . torsemide (DEMADEX) 20 MG tablet Take 84m one time a day, alternating with 282mthe alternate days.   No facility-administered encounter medications on file as of 12/03/2016.     Allergies: Shellfish allergy  Social History   Social History  . Marital status: Single    Spouse name: N/A  . Number of children: N/A  . Years of education: N/A   Occupational History  . Not on file.   Social History Main Topics  . Smoking status: Never Smoker  . Smokeless tobacco: Never Used  . Alcohol use No  . Drug use: No  . Sexual activity: Not on file   Other Topics Concern  . Not on file   Social History Narrative   Single.   [place] from [place]  Now lives with daughter.     Family History  Problem Relation Age of Onset  . Heart attack Sister   . Arrhythmia Brother     s/p AICD  . Cardiomyopathy Brother   . Diabetes type II Brother     Review of Systems: A 12-system review of systems was performed and was negative except as noted in the HPI.  --------------------------------------------------------------------------------------------------  Physical Exam: BP 120/70 (BP Location: Left Arm, Patient Position: Sitting, Cuff Size: Normal)   Pulse 63   Ht _0  (1.6 m)   Wt 148 lb 8 oz (  67.4 kg)   BMI 26.31 kg/m   General:  Well-developed, well-nourished elderly woman, seated comfortably in the exam room. She is again accompanied by her daughter. HEENT: No conjunctival pallor or scleral icterus.  Moist mucous membranes.  OP clear. Neck: Supple without lymphadenopathy, thyromegaly, JVD, or HJR. Lungs: Normal work of breathing.  Clear to auscultation bilaterally without wheezes or crackles. Heart: Regular rate and rhythm with split S2. No murmurs, rubs, or gallops.  Non-displaced PMI. Abd: Bowel sounds present.  Soft, NT/ND without hepatosplenomegaly Ext: No lower extremity edema.  Radial, PT, and DP pulses are 2+ bilaterally. Skin:  warm and dry without rash  EKG:  NSR with LBBB.  Lab Results  Component Value Date   WBC 4.0 12/03/2016   HGB 11.1 (L) 12/03/2016   HCT 34.0 (L) 12/03/2016   MCV 83.6 12/03/2016   PLT 192 12/03/2016    Lab Results  Component Value Date   NA 133 (L) 12/03/2016   K 4.5 12/03/2016   CL 100 (L) 12/03/2016   CO2 24 12/03/2016   BUN 26 (H) 12/03/2016   CREATININE 1.56 (H) 12/03/2016   GLUCOSE 273 (H) 12/03/2016   ALT 14 11/24/2016    Lab Results  Component Value Date   CHOL 135 08/01/2016   HDL 25 (L) 08/01/2016   LDLCALC 81 08/01/2016   TRIG 147 08/01/2016   CHOLHDL 5.4 (H) 08/01/2016    --------------------------------------------------------------------------------------------------  ASSESSMENT AND PLAN: Chronic systolic heart failure secondary to ischemic cardiomyopathy Ms. Spadafora appears euvolemic on exam today, though she remains about 5 pounds above her previous dry weight. I suspect that she may have also gained non-fluid weight during her month in [place]. She is tolerating her current medication regimen well, though spironolactone is still being held. We will not make any medication changes today. Given progressive decline in LVEF since last summer following CABG, we have again discussed ischemia evaluation. We will proceed with left heart catheterization with possible PCI in 2 days at [facility]. I have reviewed the risks, indications, and alternatives to cardiac catheterization, possible angioplasty, and stenting with the patient. Risks include but are not limited to bleeding, infection, vascular injury, stroke, myocardial infection, arrhythmia, kidney injury, radiation-related injury in the case of prolonged fluoroscopy use, emergency cardiac surgery, and death. The patient understands the risks of serious complication is 1-2 in 9030 with diagnostic cardiac cath and 1-2% or less with angioplasty/stenting. I will have her come in early the same day for gentle  prehydration, given chronic kidney disease.  Coronary artery disease without angina Ms. Teletha does not have any symptoms to suggest worseing coronary insufficiency. However, as above remain concerned about progrssion of her CAD or graft failure, given severe LV dysfunction following CABG last summer. We will plan for LHC later this week.  Hypertension Blood pressure is well-controlled today. No medication changes.  Hyperlipidemia LDL was 81 in 07/2016 (goal < 70). We plan to recheck a fasting lipid panel when the patient presents for cardiac catheterization on Friday and titrate up atorvastatin as tolerated to achieve goal LDL.  Diabetes mellitus At the request of Ms. Juanda Crumble' PCP, we will check hemoglobin A1c today. I defer management of diabetes to Ms. Clark.  Follow-up: Return to clinic based on results of cardiac catheterization.  Nelva Bush, MD 12/03/2016 8:05 PM

## 2016-12-05 NOTE — Interval H&P Note (Signed)
History and Physical Interval Note:  12/05/2016 12:21 PM  Megan Howard  has presented today for cardiac catheterization, with the diagnosis of chronic systolic heart failure. The various methods of treatment have been discussed with the patient and family. After consideration of risks, benefits and other options for treatment, the patient has consented to  Procedure(s): Left Heart Cath and Coronary Angiography (N/A) as a surgical intervention .  The patient's history has been reviewed, patient examined, no change in status, stable for surgery.  I have reviewed the patient's chart and labs.  Questions were answered to the patient's satisfaction.    Cath Lab Visit (complete for each Cath Lab visit)  Clinical Evaluation Leading to the Procedure:   ACS: No.  Non-ACS:    Anginal Classification: CCS III  Anti-ischemic medical therapy: Maximal Therapy (2 or more classes of medications)  Non-Invasive Test Results: No non-invasive testing performed  Prior CABG: Previous CABG   Barbara Keng

## 2016-12-05 NOTE — Discharge Instructions (Signed)
Radial Site Care °Refer to this sheet in the next few weeks. These instructions provide you with information about caring for yourself after your procedure. Your health care provider may also give you more specific instructions. Your treatment has been planned according to current medical practices, but problems sometimes occur. Call your health care provider if you have any problems or questions after your procedure. °What can I expect after the procedure? °After your procedure, it is typical to have the following: °· Bruising at the radial site that usually fades within 1-2 weeks. °· Blood collecting in the tissue (hematoma) that may be painful to the touch. It should usually decrease in size and tenderness within 1-2 weeks. °Follow these instructions at home: °· Take medicines only as directed by your health care provider. °· You may shower 24-48 hours after the procedure or as directed by your health care provider. Remove the bandage (dressing) and gently wash the site with plain soap and water. Pat the area dry with a clean towel. Do not rub the site, because this may cause bleeding. °· Do not take baths, swim, or use a hot tub until your health care provider approves. °· Check your insertion site every day for redness, swelling, or drainage. °· Do not apply powder or lotion to the site. °· Do not flex or bend the affected arm for 24 hours or as directed by your health care provider. °· Do not push or pull heavy objects with the affected arm for 24 hours or as directed by your health care provider. °· Do not lift over 10 lb (4.5 kg) for 5 days after your procedure or as directed by your health care provider. °· Ask your health care provider when it is okay to: °¨ Return to work or school. °¨ Resume usual physical activities or sports. °¨ Resume sexual activity. °· Do not drive home if you are discharged the same day as the procedure. Have someone else drive you. °· You may drive 24 hours after the procedure  unless otherwise instructed by your health care provider. °· Do not operate machinery or power tools for 24 hours after the procedure. °· If your procedure was done as an outpatient procedure, which means that you went home the same day as your procedure, a responsible adult should be with you for the first 24 hours after you arrive home. °· Keep all follow-up visits as directed by your health care provider. This is important. °Contact a health care provider if: °· You have a fever. °· You have chills. °· You have increased bleeding from the radial site. Hold pressure on the site. °Get help right away if: °· You have unusual pain at the radial site. °· You have redness, warmth, or swelling at the radial site. °· You have drainage (other than a small amount of blood on the dressing) from the radial site. °· The radial site is bleeding, and the bleeding does not stop after 30 minutes of holding steady pressure on the site. °· Your arm or hand becomes pale, cool, tingly, or numb. °This information is not intended to replace advice given to you by your health care provider. Make sure you discuss any questions you have with your health care provider. °Document Released: 08/16/2010 Document Revised: 12/20/2015 Document Reviewed: 01/30/2014 °Elsevier Interactive Patient Education © 2017 [employer] Inc. ° °

## 2016-12-05 NOTE — Brief Op Note (Signed)
Brief cardiac catheterization note (full report to follow)  Date: 12/05/2016 Time: 1:35 PM  PATIENT:  Megan Howard  71 y.o. female  PRE-OPERATIVE DIAGNOSIS:  hf  POST-OPERATIVE DIAGNOSIS:  Same  PROCEDURE:  Procedure(s): Left Heart Cath and Coronary Angiography (N/A)  SURGEON:  Surgeon(s) and Role:    Yvonne Kendall* Donella Pascarella, MD - Primary  Findings: 1. Native coronary artery disease including mild plaquing of the proximal LAD, 89% apical LAD disease, 70% mid/distal LCx stenoses, and subtotal occlusion of the mid RCA. 2. Patent SVG to OM and SVG to PDA with mild ectasia but no flow-limiting disease. 3. Patent LIMA, which is ungrafted. 4. Normal left ventricular filling pressure.  Recommendations: 1. Medical therapy of chronic systolic heart failure.  Yvonne Kendallhristopher Hernando Reali, MD [facility] Pager: [phone]

## 2016-12-08 ENCOUNTER — Telehealth: Payer: Self-pay | Admitting: *Deleted

## 2016-12-08 ENCOUNTER — Encounter (HOSPITAL_COMMUNITY): Payer: Self-pay | Admitting: Internal Medicine

## 2016-12-08 MED FILL — Heparin Sodium (Porcine) Inj 1000 Unit/ML: INTRAMUSCULAR | Qty: 10 | Status: AC

## 2016-12-08 MED FILL — Heparin Sodium (Porcine) 2 Unit/ML in Sodium Chloride 0.9%: INTRAMUSCULAR | Qty: 500 | Status: AC

## 2016-12-08 NOTE — Telephone Encounter (Signed)
Patient need BMET middle of this week to check renal function, given her CKD, per Dr End. Patient also needs to be seen before she goes out of town. Spoke with Camelia Engerri, patient's daughter. Patient leaves on 12/16/16. Appt scheduled for 12/10/16 at 1300 and lab work will be drawn at appt or at medical mall after appt. She verbalized understanding.

## 2016-12-10 ENCOUNTER — Other Ambulatory Visit: Payer: Self-pay | Admitting: *Deleted

## 2016-12-10 ENCOUNTER — Other Ambulatory Visit
Admission: RE | Admit: 2016-12-10 | Discharge: 2016-12-10 | Disposition: A | Discharge status: Home / Self Care | Payer: Medicare Other | Source: Ambulatory Visit | Attending: Internal Medicine | Admitting: Internal Medicine

## 2016-12-10 ENCOUNTER — Ambulatory Visit: Payer: Medicare Other | Admitting: Internal Medicine

## 2016-12-10 DIAGNOSIS — N189 Chronic kidney disease, unspecified: Secondary | ICD-10-CM

## 2016-12-10 LAB — BASIC METABOLIC PANEL
Anion gap: 9 (ref 5–15)
BUN: 30 mg/dL — AB (ref 6–20)
CHLORIDE: 100 mmol/L — AB (ref 101–111)
CO2: 25 mmol/L (ref 22–32)
CREATININE: 1.63 mg/dL — AB (ref 0.44–1.00)
Calcium: 9.2 mg/dL (ref 8.9–10.3)
GFR calc Af Amer: 35 mL/min — ABNORMAL LOW (ref >60)
GFR calc non Af Amer: 30 mL/min — ABNORMAL LOW (ref >60)
Glucose, Bld: 291 mg/dL — ABNORMAL HIGH (ref 65–99)
POTASSIUM: 4.1 mmol/L (ref 3.5–5.1)
Sodium: 134 mmol/L — ABNORMAL LOW (ref 135–145)

## 2016-12-16 ENCOUNTER — Ambulatory Visit (INDEPENDENT_AMBULATORY_CARE_PROVIDER_SITE_OTHER): Payer: Medicare Other | Admitting: Nurse Practitioner

## 2016-12-16 ENCOUNTER — Encounter: Payer: Self-pay | Admitting: Nurse Practitioner

## 2016-12-16 VITALS — BP 138/78 | HR 55 | Ht 63.0 in | Wt 150.5 lb

## 2016-12-16 DIAGNOSIS — I5022 Chronic systolic (congestive) heart failure: Secondary | ICD-10-CM

## 2016-12-16 DIAGNOSIS — N183 Chronic kidney disease, stage 3 unspecified: Secondary | ICD-10-CM

## 2016-12-16 DIAGNOSIS — I110 Hypertensive heart disease with heart failure: Secondary | ICD-10-CM

## 2016-12-16 DIAGNOSIS — E785 Hyperlipidemia, unspecified: Secondary | ICD-10-CM | POA: Diagnosis not present

## 2016-12-16 DIAGNOSIS — I255 Ischemic cardiomyopathy: Secondary | ICD-10-CM

## 2016-12-16 DIAGNOSIS — I2510 Atherosclerotic heart disease of native coronary artery without angina pectoris: Secondary | ICD-10-CM | POA: Diagnosis not present

## 2016-12-16 MED ORDER — HYDRALAZINE HCL 25 MG PO TABS: 25.0000 mg | ORAL_TABLET | Freq: Three times a day (TID) | ORAL | 3 refills | Status: AC

## 2016-12-16 NOTE — Patient Instructions (Signed)
Medication Instructions:  Your physician has recommended you make the following change in your medication:  1. INCREASE Hydralazine 25 mg Three times a day  Labwork: We will call you with lab results from today.   Follow-Up: Your physician recommends that you schedule a follow-up appointment at the end of July with Dr. Okey DupreEnd.  It was a pleasure seeing you today here in the office. Please do not hesitate to give us a call back if you have any further questions. [phone]  [place] CellarPamela A. RN, BSN

## 2016-12-16 NOTE — Progress Notes (Signed)
Office Visit    Patient Name: Megan Howard Date of Encounter: 12/16/2016  Primary Care Provider:  Pleas Koch, NP Primary Cardiologist:  Andree Coss, MD   Chief Complaint    71 year old female with prior history of coronary artery disease status post coronary artery bypass grafting in June 2017, ischemic cardiomyopathy with an EF of 30-35%, HFrEF, hypertension, hyperlipidemia, diabetes, and stage III chronic kidney disease, who presents for follow-up after recent catheterization.  Past Medical History    Past Medical History:  Diagnosis Date  . Anemia   . Cardiomyopathy, ischemic    a. 01/2016 Echo: EF 67%; b. 03/2016 Echo: EF 40%; c. 06/2016 Echo: EF 30-35%; d. 07/2016 Echo: EF 30-35%, MAC w/ mild MR, mod dil LA, mildly dil RA, mild TR, RVSP 38mHg.  .[place] KitchenCoronary artery disease    a. [place] L[place] 99d, LCX 897mRCA 9931m5-80d (unsuccessful PCI);  b. 12/2015 CABGx2 ([place]): VG->OM3, VG->RCA;  c. 11/2016 Cath: LM 20, LAD 2m46md, D1 30, LCX 55m,83m, OM2/3 mod dzs, RCA 70p, 100m, 58m small, VG->RCA mildly ectatic, min irregs, VG->OM3 nl.  . Diabetes mellitus without complication (HCC)  [place]   . HFrEF (heart failure with reduced ejection fraction) (HCC)  [place]. 07/2016 Echo: EF 30-35%.  . Hypertension    Past Surgical History:  Procedure Laterality Date  . BYPASS GRAFT ANGIOGRAPHY N/A 12/05/2016   Procedure: Bypass Graft Angiography;  Surgeon: End, CNelva Bush [facility];  Service: Cardiovascular;  Laterality: N/A;  . CARDIAC CATHETERIZATION    . CORONARY ANGIOPLASTY    . CORONARY ARTERY BYPASS GRAFT  12/2015   CABG x 2  . LEFT HEART CATH AND CORONARY ANGIOGRAPHY N/A 12/05/2016   Procedure: Left Heart Cath and Coronary Angiography;  Surgeon: End, CNelva Bush [facility];  Service: Cardiovascular;  Laterality: N/A;    Allergies  Allergies  Allergen Reactions  . Shellfish Allergy     Face swelling     History of Present Illness    70 yea70old female with the above complex past medical history including coronary artery disease with severe multivessel disease noted [place] with unsuccessful attempt at PCI of RCA. She was subsequently treated with coronary artery bypass grafting 2 with vein graft to the distal RCA and vein graft to the third obtuse marginal. She was left with LV dysfunction with EF subsequently noted to be 30-35% by January 2018.  Other history includes hypertension, hyperlipidemia, diabetes, and stage III chronic kidney disease. Earlier this month, she has significant volume overload and required switching of home diuretics from furosemide to torsemide. This did improve her symptoms. Due to heart failure symptoms, decision was made to pursue diagnostic catheterization, which took place last week and showed to have 2 patent grafts with some stable three-vessel native coronary artery disease. Medical therapy is recommended. Follow-up basic metabolic panel last week showed relatively stable renal function.  She has done well over the past week. She says her weight has been stable at home at 147 pounds. This is actually up 2-4 pounds from where she was a few weeks ago but she has not been experiencing any dyspnea on exertion, PND, orthopnea, dizziness, syncope, edema, or early satiety. Her catheterization site is healed well. We talked some today about her diet and turns out that she does use quite a bit of salt. She eats processed breakfast meats regularly.  Home Medications    Prior to Admission medications   Medication Sig Start Date End Date Taking? Authorizing Provider  aspirin EC 81 MG tablet Take 81 mg by mouth daily.    [provider]  atorvastatin (LIPITOR) 20 MG tablet Take 1 tablet (20 mg total) by mouth daily. 08/01/16 12/04/16  End, Harrell Gave, MD  carvedilol (COREG) 12.5 MG tablet Take 1 tablet (12.5 mg total)  by mouth 2 (two) times daily with a meal. 08/29/16   Pleas Koch, NP  enalapril (VASOTEC) 20 MG tablet Take 1 tablet (20 mg total) by mouth 2 (two) times daily. 11/18/16   End, Harrell Gave, MD  escitalopram (LEXAPRO) 20 MG tablet Take 1 tablet (20 mg total) by mouth daily. 09/10/16   Pleas Koch, NP  glipiZIDE (GLUCOTROL XL) 2.5 MG 24 hr tablet Take 1 tablet (2.5 mg total) by mouth daily with breakfast. 12/05/16   Pleas Koch, NP  glipiZIDE (GLUCOTROL) 5 MG tablet Take 1 tablet (5 mg total) by mouth 2 (two) times daily before a meal. For diabetes 08/31/16   Pleas Koch, NP  hydrALAZINE (APRESOLINE) 25 MG tablet Take 1 tablet (25 mg total) by mouth 2 (two) times daily. 07/30/16   Pleas Koch, NP  isosorbide dinitrate (ISORDIL) 20 MG tablet Take 1 tablet (20 mg total) by mouth 3 (three) times daily. 11/14/16   End, Harrell Gave, MD  latanoprost (XALATAN) 0.005 % ophthalmic solution Place 1 drop into both eyes at bedtime.    [provider]  Magnesium Oxide 400 MG CAPS Take 1 capsule (400 mg total) by mouth daily. 07/30/16   Pleas Koch, NP  pantoprazole (PROTONIX) 40 MG tablet Take 1 tablet (40 mg total) by mouth daily. 07/30/16   Pleas Koch, NP  Potassium Chloride ER 20 MEQ TBCR Take 2 tablets (40 mEq) by mouth once a day. 11/25/16   End, Harrell Gave, MD  torsemide (DEMADEX) 20 MG tablet Take 14m one time a day, alternating with 295mthe alternate days. 12/01/16   DuRise MuPA-C    Review of Systems    She denies chest pain, palpitations, dyspnea, pnd, orthopnea, n, v, dizziness, syncope, edema, weight gain, or early satiety.  All other systems reviewed and are otherwise negative except as noted above.  Physical Exam    VS:  BP 138/78 (BP Location: Left Arm, Patient Position: Sitting, Cuff Size: Normal)   Pulse (!) 55   Ht _0  (1.6 m)   Wt 150 lb 8 oz (68.3 kg)   BMI 26.66 kg/m  , BMI Body mass index is 26.66 kg/m. GEN: Well nourished, well  developed, in no acute distress.  HEENT: normal.  Neck: Supple, no JVD, carotid bruits, or masses. Cardiac: RRR, no murmurs, rubs, or gallops. No clubbing, cyanosis, edema.  Radials/DP/PT 2+ and equal bilaterally. Left wrist catheterization site without bleeding, bruit, or hematoma. Respiratory:  Respirations regular and unlabored, clear to auscultation bilaterally. GI: Soft, nontender, nondistended, BS + x 4. MS: no deformity or atrophy. Skin: warm and dry, no rash. Neuro:  Strength and sensation are intact. Psych: Normal affect.  Accessory Clinical Findings    ECG - Sinus bradycardia, 55, left bundle branch block.  Lab Results  Component Value Date   CREATININE 1.63 (H) 12/10/2016   BUN 30 (H) 12/10/2016   NA 134 (L) 12/10/2016   K 4.1 12/10/2016   CL 100 (L) 12/10/2016   CO2 25 12/10/2016    Assessment & Plan  1.  Chronic systolic congestive heart failure/ischemic cardiomyopathy: Patient is euvolemic on exam today. Her weight has been stable at 147 pounds at home. She is on a good regimen including beta blocker, ACE inhibitor, hydralazine, and nitrate. She is currently tolerating current dose of torsemide, which is 40 alternating with 20 mg every other day. She either uses salt or eats processed foods fairly regularly at home.  We discussed the importance of daily weights, sodium restriction, medication compliance, and symptom reporting and she verbalizes understanding. Her blood pressure is elevated today, and I am increasing her hydralazine to 25 mg 3 times a day. Could consider Entresto in the future since she has tolerated it enalapril in the past. She'll be going away to [place] for the next 2 months, so now is not the time to initiate this.  2. Coronary artery disease: Status post prior bypass surgery in 2017 [place]. She recently underwent diagnostic catheterization revealing 2 of 2 patent grafts with native coronary disease. She has not been having any chest pain. Heart  failure symptoms have stabilized. She remains on aspirin, statin, beta blocker, and nitrate therapy. Next  3. Stage III chronic kidney disease: Creatinine mildly elevated above baseline last week after catheterization. Follow-up today.  4. Hypertensive heart disease with CHF: Blood pressure is 138/78 initially. Repeat was 150/80. I'm increasing her hydralazine to 25 mg 3 times a day. She otherwise remains on beta blocker, ACE inhibitor, nitrate, and diuretic.  5. Hyperlipidemia: Continue Lipitor therapy. LDL was 56 on May 11 with normal LFTs on April 30.  6. Disposition: Follow-up basic metabolic panel today. Patient is leaving this afternoon for [place] where she will be until mid July. We will call her with lab results and I have advised her to call us for any changes in symptoms or weight. Plan to follow-up at the end of July.   Murray Hodgkins, NP 12/16/2016, 10:20 AM

## 2016-12-17 LAB — BASIC METABOLIC PANEL
BUN/Creatinine Ratio: 18 (ref 12–28)
BUN: 28 mg/dL — ABNORMAL HIGH (ref 8–27)
CALCIUM: 9.4 mg/dL (ref 8.7–10.3)
CO2: 23 mmol/L (ref 18–29)
Chloride: 98 mmol/L (ref 96–106)
Creatinine, Ser: 1.56 mg/dL — ABNORMAL HIGH (ref 0.57–1.00)
GFR calc Af Amer: 38 mL/min/{1.73_m2} — ABNORMAL LOW (ref >59)
GFR, EST NON AFRICAN AMERICAN: 33 mL/min/{1.73_m2} — AB (ref >59)
Glucose: 215 mg/dL — ABNORMAL HIGH (ref 65–99)
POTASSIUM: 4.6 mmol/L (ref 3.5–5.2)
SODIUM: 137 mmol/L (ref 134–144)

## 2016-12-23 ENCOUNTER — Other Ambulatory Visit: Payer: Self-pay | Admitting: Primary Care

## 2016-12-31 ENCOUNTER — Other Ambulatory Visit: Payer: Self-pay | Admitting: Primary Care

## 2017-01-10 ENCOUNTER — Ambulatory Visit: Payer: Medicare Other | Admitting: Internal Medicine

## 2017-01-12 ENCOUNTER — Ambulatory Visit: Payer: Medicare Other | Admitting: Podiatry

## 2017-01-20 ENCOUNTER — Other Ambulatory Visit: Payer: Self-pay | Admitting: Primary Care

## 2017-02-13 ENCOUNTER — Other Ambulatory Visit: Payer: Self-pay | Admitting: Primary Care

## 2017-02-13 DIAGNOSIS — Z794 Long term (current) use of insulin: Principal | ICD-10-CM

## 2017-02-13 DIAGNOSIS — E119 Type 2 diabetes mellitus without complications: Secondary | ICD-10-CM

## 2017-02-14 ENCOUNTER — Other Ambulatory Visit: Payer: Self-pay | Admitting: Internal Medicine

## 2017-02-16 ENCOUNTER — Ambulatory Visit (INDEPENDENT_AMBULATORY_CARE_PROVIDER_SITE_OTHER): Payer: Medicare Other | Admitting: Podiatry

## 2017-02-16 DIAGNOSIS — M2012 Hallux valgus (acquired), left foot: Secondary | ICD-10-CM

## 2017-02-16 DIAGNOSIS — M79676 Pain in unspecified toe(s): Secondary | ICD-10-CM | POA: Diagnosis not present

## 2017-02-16 DIAGNOSIS — B351 Tinea unguium: Secondary | ICD-10-CM

## 2017-02-16 DIAGNOSIS — L608 Other nail disorders: Secondary | ICD-10-CM

## 2017-02-16 DIAGNOSIS — E1142 Type 2 diabetes mellitus with diabetic polyneuropathy: Secondary | ICD-10-CM

## 2017-02-16 DIAGNOSIS — M2011 Hallux valgus (acquired), right foot: Secondary | ICD-10-CM

## 2017-02-16 NOTE — Progress Notes (Signed)
   Subjective:    Patient ID: Megan Howard, female    DOB: 03/18/1946, 71 y.o.   MRN: [id number]  HPI This patient presents to the office with chief complaint of painful long thick nails.  She says the nails are painful walking and wearing her shoes.  Patient is diabetic.  She presents for preventive foot care services. Dhe is interested in acquiring diabetic shoes.    Review of Systems     Objective:   Physical Exam GENERAL APPEARANCE: Alert, conversant. Appropriately groomed. No acute distress.  VASCULAR: Pedal pulses are  palpable at  DP bilateral.  PT pulses are non-palpable.  Capillary refill time is immediate to all digits,  Normal temperature noted. NEUROLOGIC: sensation is diminished  to 5.07 monofilament at 5/5 sites bilateral.  Light touch is intact bilateral, Muscle strength normal.  MUSCULOSKELETAL: acceptable muscle strength, tone and stability bilateral.  HAV  B/L.  DERMATOLOGIC: skin color, texture, and turgor are within normal limits.  No preulcerative lesions or ulcers  are seen, no interdigital maceration noted.  No open lesions present.   No drainage noted.  NAILS  Thick disfigured discolored nails both feet.  Pincer nails noted.         Assessment & Plan:  Onychomycosis  Diabetes with angiopathy.  Diabetic neuropathy.  IE  Debride nails both feet.  RTC 3 months.  Make an appointment with Raiford Nobleick.   Megan Howard DPM

## 2017-02-18 ENCOUNTER — Encounter: Payer: Self-pay | Admitting: Internal Medicine

## 2017-02-18 ENCOUNTER — Ambulatory Visit (INDEPENDENT_AMBULATORY_CARE_PROVIDER_SITE_OTHER): Payer: Medicare Other | Admitting: Internal Medicine

## 2017-02-18 VITALS — BP 154/72 | HR 63 | Ht 62.0 in | Wt 156.5 lb

## 2017-02-18 DIAGNOSIS — Z79899 Other long term (current) drug therapy: Secondary | ICD-10-CM

## 2017-02-18 DIAGNOSIS — E785 Hyperlipidemia, unspecified: Secondary | ICD-10-CM | POA: Diagnosis not present

## 2017-02-18 DIAGNOSIS — I10 Essential (primary) hypertension: Secondary | ICD-10-CM

## 2017-02-18 DIAGNOSIS — I5022 Chronic systolic (congestive) heart failure: Secondary | ICD-10-CM

## 2017-02-18 DIAGNOSIS — I2510 Atherosclerotic heart disease of native coronary artery without angina pectoris: Secondary | ICD-10-CM

## 2017-02-18 MED ORDER — ISOSORBIDE MONONITRATE ER 60 MG PO TB24: 60.0000 mg | ORAL_TABLET | Freq: Every day | ORAL | 3 refills | Status: AC

## 2017-02-18 MED ORDER — SPIRONOLACTONE 25 MG PO TABS: 25.0000 mg | ORAL_TABLET | Freq: Every day | ORAL | 3 refills | Status: DC

## 2017-02-18 NOTE — Patient Instructions (Signed)
Medication Instructions:  Your physician has recommended you make the following change in your medication:  1- STOP taking Isordil. 2- START taking Isosorbide Mononitrate 60 mg (1 tablet) by mouth once a day. 3- START taking Spironolactone 25 mg (1 tablet) by mouth once a day.   Labwork: Your physician recommends that you return for lab work in: TODAY (BMP.  Your physician recommends that you return for lab work in: 1 WEEK (BMP) AT THE MEDICAL MALL. -[facility] You will check in at the front desk to the right as you walk into the atrium. Valet Parking is offered if needed.    Testing/Procedures: Your physician has requested that you have an echocardiogram. Echocardiography is a painless test that uses sound waves to create images of your heart. It provides your doctor with information about the size and shape of your heart and how well your heart's chambers and valves are working. This procedure takes approximately one hour. There are no restrictions for this procedure.    Follow-Up: Your physician recommends that you schedule a follow-up appointment AT THE END OF September WITH DR END.   If you need a refill on your cardiac medications before your next appointment, please call your pharmacy.   Echocardiogram An echocardiogram, or echocardiography, uses sound waves (ultrasound) to produce an image of your heart. The echocardiogram is simple, painless, obtained within a short period of time, and offers valuable information to your health care provider. The images from an echocardiogram can provide information such as:  Evidence of coronary artery disease (CAD).  Heart size.  Heart muscle function.  Heart valve function.  Aneurysm detection.  Evidence of a past heart attack.  Fluid buildup around the heart.  Heart muscle thickening.  Assess heart valve function.  Tell a health care provider about:  Any allergies you have.  All medicines you  are taking, including vitamins, herbs, eye drops, creams, and over-the-counter medicines.  Any problems you or family members have had with anesthetic medicines.  Any blood disorders you have.  Any surgeries you have had.  Any medical conditions you have.  Whether you are pregnant or may be pregnant. What happens before the procedure? No special preparation is needed. Eat and drink normally. What happens during the procedure?  In order to produce an image of your heart, gel will be applied to your chest and a wand-like tool (transducer) will be moved over your chest. The gel will help transmit the sound waves from the transducer. The sound waves will harmlessly bounce off your heart to allow the heart images to be captured in real-time motion. These images will then be recorded.  You may need an IV to receive a medicine that improves the quality of the pictures. What happens after the procedure? You may return to your normal schedule including diet, activities, and medicines, unless your health care provider tells you otherwise. This information is not intended to replace advice given to you by your health care provider. Make sure you discuss any questions you have with your health care provider. Document Released: 07/11/2000 Document Revised: 03/01/2016 Document Reviewed: 03/21/2013 Elsevier Interactive Patient Education  2017 [employer]

## 2017-02-18 NOTE — Progress Notes (Signed)
Follow-up Outpatient Visit Date: 02/18/2017  Primary Care Provider: Pleas Koch, NP [place] [address]  Chief Complaint: Follow-up chronic systolic heart failure  HPI:  Megan Howard is a 71 y.o. year-old female with history of coronary artery disease status post CABG in 12/2015, ischemic cardiomyopathy, postoperative atrial fibrillation, type 2 diabetes mellitus, chronic kidney disease stage III, and GERD, who presents for follow-up of heart failure. I last saw her on 12/03/16, at which time we agreed to proceed with cardiac catheterization. This revealed patent SVG to OM 2 and SVG to RPDA. Apical LAD disease was noted as well. We agreed to continue with medical therapy. She was seen in follow-up by Burtis Junes, NP, on 12/16/16, at which time she was feeling well. Hydralazine was increased to 25 mg 3 times a day at that time.  Today, Megan Howard reports feeling well. She has not had any chest pain or shortness of breath. She gained some weight while in [place] but does not feel that she is retaining fluid. She followed up with a cardiologist in [place], [place], who felt like Megan Howard was doing well. Her only complaint today is of occasional pain in the right hand, predominantly when making a fist. She denies palpitations, lightheadedness, orthopnea, and PND. She has been compliant with her medications but would like to consider switching from isosorbide dinitrate to isosorbide mononitrate due to cost issues related to the former agent.  --------------------------------------------------------------------------------------------------  Cardiovascular History & Procedures: Cardiovascular Problems:  Coronary artery disease  Ischemic cardiomyopathy  Postoperative atrial fibrillation  Risk Factors:  Known coronary artery disease, diabetes mellitus, family history, and age greater than 10  Cath/PCI:  LHC (12/05/16): LMCA with 20% disease. LAD with 20% proximal  and 80% apical stenosis. Moderate D1 with 30% proximal stenosis. LCx with 70% mid vessel disease and diffuse stenosis of small OM branch. 40% distal LAD stenosis also present. Dominant RCA with 70% proximal and 100% mid vessel stenoses. Patent SVG to OM and SVG to rPDA. SVG to rPDA with mild diffuse disease.  LHC (12/29/15, [facility]): LMCA with mild luminal irregularities. LAD with mild proximal disease and 99% apical stenosis. Codominant LCx with 80% mid vessel stenosis. Small, codominant RCA with 99% mid and 75-80% distal stenoses. Unsuccessful PCI to mid RCA due to inability to expand stenosis and deploy stent. 80% and 90% residual stenoses remain. LVEDP 21 mmHg. LVEF 45% with inferior hypokinesis.  CV Surgery:  CABG (12/2015 in [place]): SVG to OM2 and SVG to RCA.  EP Procedures and Devices:  Holter monitor (2017): Reportedly no evidence of atrial fibrillation.  Non-Invasive Evaluation(s):  Limited TTE (08/19/16): Normal LV size with mild LVH. LVEF 30-35%. MAC with mild MR present. LA moderately dilated. RA mildly dilated. Mild TR. Mild-to moderate pulmonary hypertension (RVSP 42 mmHg).  TTE (07/03/16): Mildly dilated LV with severely reduced contraction (EF 30-35%). There is diffuse hypokinesis with severe hypokinesis of the anterior and anteroseptal walls. 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 There is mild left atrial enlargement. RV size is moderately enlarged with moderately reduced function. There is moderate TR. Moderate pulmonary hypertension also evident.  TTE (04/20/16): Mild LVH with LVEF 40% and moderate global hypokinesis. Restrictive filling pattern noted. Decreased RV contraction with evidence of RV volume overload. Aortic sclerosis. Mild mitral and moderate tricuspid regurgitation. Moderate to severe pulmonary hypertension.  TTE (02/13/16): Normal LV size and function with LVEF of 67%. Mild LVH present. 2+ tricuspid and 1+  pulmonic regurgitation  present. Mild pulmonary hypertension.  Recent CV Pertinent Labs: Lab Results  Component Value Date   CHOL 105 12/05/2016   CHOL 135 08/01/2016   HDL 28 (L) 12/05/2016   HDL 25 (L) 08/01/2016   LDLCALC 56 12/05/2016   LDLCALC 81 08/01/2016   TRIG 103 12/05/2016   CHOLHDL 3.8 12/05/2016   INR 1.12 12/03/2016   BNP >4,500.0 (H) 06/25/2016   K 4.6 12/16/2016   BUN 28 (H) 12/16/2016   CREATININE 1.56 (H) 12/16/2016    Past medical and surgical history were reviewed and updated in EPIC.  Current Meds  Medication Sig  . aspirin EC 81 MG tablet Take 81 mg by mouth daily.  . carvedilol (COREG) 12.5 MG tablet Take 1 tablet (12.5 mg total) by mouth 2 (two) times daily with a meal.  . enalapril (VASOTEC) 20 MG tablet Take 1 tablet (20 mg total) by mouth 2 (two) times daily.  Marland Kitchen escitalopram (LEXAPRO) 20 MG tablet Take 1 tablet (20 mg total) by mouth daily.  Marland Kitchen glipiZIDE (GLUCOTROL XL) 2.5 MG 24 hr tablet Take 1 tablet (2.5 mg total) by mouth daily with breakfast.  . glipiZIDE (GLUCOTROL) 5 MG tablet TAKE 1 TABLET (5 MG TOTAL) BY MOUTH 2 (TWO) TIMES DAILY BEFORE A MEAL. FOR DIABETES  . hydrALAZINE (APRESOLINE) 25 MG tablet Take 1 tablet (25 mg total) by mouth 3 (three) times daily.  . isosorbide dinitrate (ISORDIL) 20 MG tablet TAKE 1 TABLET BY MOUTH 3 TIMES DAILY.  Marland Kitchen latanoprost (XALATAN) 0.005 % ophthalmic solution Place 1 drop into both eyes at bedtime.  . magnesium oxide (MAG-OX) 400 (241.3 Mg) MG tablet TAKE 1 TABLET (400 MG TOTAL) BY MOUTH DAILY.  . pantoprazole (PROTONIX) 40 MG tablet TAKE 1 TABLET (40 MG TOTAL) BY MOUTH DAILY.  Marland Kitchen Potassium Chloride ER 20 MEQ TBCR Take 2 tablets (40 mEq) by mouth once a day.  . torsemide (DEMADEX) 20 MG tablet Take 65m one time a day, alternating with 29mthe alternate days.    Allergies: Shellfish allergy  Social History   Social History  . Marital status: Single    Spouse name: N/A  . Number of children: N/A  . Years of education: N/A    Occupational History  . Not on file.   Social History Main Topics  . Smoking status: Never Smoker  . Smokeless tobacco: Never Used  . Alcohol use No  . Drug use: No  . Sexual activity: Not on file   Other Topics Concern  . Not on file   Social History Narrative   Single.   [place]oved from [place]  Now lives with daughter.     Family History  Problem Relation Age of Onset  . Heart attack Sister   . Arrhythmia Brother        s/p AICD  . Cardiomyopathy Brother   . Diabetes type II Brother     Review of Systems: A 12-system review of systems was performed and was negative except as noted in the HPI.  --------------------------------------------------------------------------------------------------  Physical Exam: BP (!) 154/72 (BP Location: Left Arm, Patient Position: Sitting, Cuff Size: Normal)   Pulse 63   Ht 5' 2"  (1.575 m)   Wt 156 lb 8 oz (71 kg)   BMI 28.62 kg/m   General:  Overweight woman, seated comfortably in the exam room. She is accompanied by her daughter, Megan MarkHEENT: No conjunctival pallor or scleral icterus. Moist mucous membranes.  OP clear. Neck: Supple without lymphadenopathy,  thyromegaly, JVD, or HJR. No carotid bruit. Lungs: Normal work of breathing. Clear to auscultation bilaterally without wheezes or crackles. Heart: Regular rate and rhythm without murmurs, rubs, or gallops. Non-displaced PMI. Abd: Bowel sounds present. Soft, NT/ND without hepatosplenomegaly Ext: Trace pretibial edema bilaterally. Radial, PT, and DP pulses are 2+ bilaterally. Left radial arteriotomy site is well-healed. Skin: Warm and dry without rash.  EKG:  Normal sinus rhythm with left bundle branch block.  Lab Results  Component Value Date   WBC 4.0 12/03/2016   HGB 11.1 (L) 12/03/2016   HCT 34.0 (L) 12/03/2016   MCV 83.6 12/03/2016   PLT 192 12/03/2016    Lab Results  Component Value Date   NA 137 12/16/2016   K 4.6 12/16/2016   CL 98 12/16/2016   CO2 23  12/16/2016   BUN 28 (H) 12/16/2016   CREATININE 1.56 (H) 12/16/2016   GLUCOSE 215 (H) 12/16/2016   ALT 14 11/24/2016    Lab Results  Component Value Date   CHOL 105 12/05/2016   HDL 28 (L) 12/05/2016   LDLCALC 56 12/05/2016   TRIG 103 12/05/2016   CHOLHDL 3.8 12/05/2016    --------------------------------------------------------------------------------------------------  ASSESSMENT AND PLAN: Chronic systolic heart failure secondary to ischemic cardiomyopathy Megan Howard has gained about 5 pounds since her prior visit with [place] in May, she overall appears euvolemic and will come stay with NYHA class II heart failure symptoms. She is tolerating her current medication regimen well. I think it is reasonable to switch from isosorbide dinitrate to isosorbide mononitrate 60 mg daily, if this will be more affordable for her. We will recheck her renal function today. If it is stable, I would like to add back spironolactone to optimize her evidence-based heart failure therapy. We will continue her current doses of carvedilol and enalapril. I have instructed Megan Howard to weigh herself daily. If she gains more than 2 pounds in a day or 5 pounds in a week, she should take 40 mg of torsemide instead of the next scheduled dose of 20 mg. I would like to recheck an echocardiogram to see if her LVEF has improved. If not, we will need to consider referral to EP for consideration of BiV/ICD.  Coronary artery disease without angina No symptoms to suggest worsening coronary insufficiency. Catheterization in May showed three-vessel CAD but patent grafts to the distal RCA/RPDA and OM branch. LAD has significant apical disease that is too distal for revascularization. We will continue current medical therapy.  Hypertension Blood pressure is suboptimally controlled today. As above, we will switch isosorbide dinitrate to isosorbide mononitrate and add spironolactone. Hopefully, this will facilitate some  improvement in her blood pressure.  Hyperlipidemia LDL in 11/2016 was at goal at 56 (goal less than 70). Continue atorvastatin 20 mg daily.  Follow-up: Return to clinic in late September, as patient is planning to return to [place] in October.  Nelva Bush, MD 02/18/2017 10:31 AM

## 2017-02-19 LAB — BASIC METABOLIC PANEL
BUN/Creatinine Ratio: 15 (ref 12–28)
BUN: 23 mg/dL (ref 8–27)
CALCIUM: 9.1 mg/dL (ref 8.7–10.3)
CHLORIDE: 96 mmol/L (ref 96–106)
CO2: 23 mmol/L (ref 20–29)
Creatinine, Ser: 1.49 mg/dL — ABNORMAL HIGH (ref 0.57–1.00)
GFR, EST AFRICAN AMERICAN: 40 mL/min/{1.73_m2} — AB (ref >59)
GFR, EST NON AFRICAN AMERICAN: 35 mL/min/{1.73_m2} — AB (ref >59)
Glucose: 370 mg/dL — ABNORMAL HIGH (ref 65–99)
POTASSIUM: 4 mmol/L (ref 3.5–5.2)
SODIUM: 136 mmol/L (ref 134–144)

## 2017-02-20 ENCOUNTER — Other Ambulatory Visit: Payer: Self-pay

## 2017-02-20 DIAGNOSIS — I5021 Acute systolic (congestive) heart failure: Secondary | ICD-10-CM

## 2017-02-20 DIAGNOSIS — I10 Essential (primary) hypertension: Secondary | ICD-10-CM

## 2017-02-27 ENCOUNTER — Ambulatory Visit (INDEPENDENT_AMBULATORY_CARE_PROVIDER_SITE_OTHER): Payer: Medicare Other | Admitting: Primary Care

## 2017-02-27 ENCOUNTER — Encounter: Payer: Self-pay | Admitting: Primary Care

## 2017-02-27 VITALS — BP 110/60 | HR 68 | Ht 62.0 in | Wt 154.0 lb

## 2017-02-27 DIAGNOSIS — F329 Major depressive disorder, single episode, unspecified: Secondary | ICD-10-CM

## 2017-02-27 DIAGNOSIS — E119 Type 2 diabetes mellitus without complications: Secondary | ICD-10-CM | POA: Diagnosis not present

## 2017-02-27 DIAGNOSIS — I255 Ischemic cardiomyopathy: Secondary | ICD-10-CM | POA: Diagnosis not present

## 2017-02-27 DIAGNOSIS — Z794 Long term (current) use of insulin: Secondary | ICD-10-CM | POA: Diagnosis not present

## 2017-02-27 DIAGNOSIS — F419 Anxiety disorder, unspecified: Secondary | ICD-10-CM | POA: Diagnosis not present

## 2017-02-27 DIAGNOSIS — K219 Gastro-esophageal reflux disease without esophagitis: Secondary | ICD-10-CM

## 2017-02-27 LAB — HEMOGLOBIN A1C: Hgb A1c MFr Bld: 10.1 % — ABNORMAL HIGH (ref 4.6–6.5)

## 2017-02-27 MED ORDER — INSULIN PEN NEEDLE 30G X 8 MM MISC: 6.0000 | Status: DC | PRN

## 2017-02-27 MED ORDER — "PEN NEEDLES 3/16"" 31G X 5 MM MISC": 5 refills | Status: DC

## 2017-02-27 MED ORDER — INSULIN GLARGINE 100 UNIT/ML SOLOSTAR PEN: 10.0000 [IU] | PEN_INJECTOR | Freq: Every evening | SUBCUTANEOUS | 11 refills | Status: DC

## 2017-02-27 NOTE — Assessment & Plan Note (Signed)
Denies GERD symptoms. Will discontinue pantoprazole 40 mg as this is a very high dose, especially given age. Would like to monitor for symptoms of GERD and have her report if symptoms return. Consider Zantac/Pepcid if symptoms return.

## 2017-02-27 NOTE — Assessment & Plan Note (Signed)
Doing well since for 9 months, patient and daughter thinks this is due to reduced fluid retention and more independence. Will slowly wean off Lexpro and have her daughter update me if any problems.

## 2017-02-27 NOTE — Progress Notes (Signed)
Subjective:    Patient ID: Megan Howard, female    DOB: [birth year], 71 y.o.   MRN: [id number]  HPI  [name] is a 71 year old female who presents today for follow up.  1) Type 2 Diabetes: Previously managed on Lantus 10 units HS. Family and patient wanted to try coming off of Lantus about 6 months ago despite A1C being well controlled on this regimen. Her renal function could not handle Metformin so she was initiated on Glipizide 5 mg BID. Her A1C was rising despite treatment with Glipizide 5 mg BID, therefore, an additional 2.5 mg XL was added to her regimen. Since her last visit her home blood sugars have elevated. She recently return from a three month long visit to [place] and admits to a poor diet. Her fasting blood sugars are running in the low 300's.   Last A1C: 7.5 in May 2018 Last Eye Exam: April 2018 Last Foot Exam: Following with podiatry Pneumonia Vaccination: ACE/ARB: Enalapril Statin: Managed on atorvastatin  Diet currently consists of:  Breakfast: Banana, sometimes frosted flakes Lunch: Rice and beans, tuna sandwich Dinner: Rice and beans, baked chicken, salad, green beans, sometimes fried, pizza. Snacks: Fruit (grapes, cherries, bananas) Desserts: Occasional  Beverages: Coffee, flavored water, water, some lemonade  Exercise: Walking when able  2) GERD: Currently managed on pantoprazole 40 mg. She denies symptoms of esophageal reflux. She believes this medication was started last year during her chest pain episodes as it was thought her symptoms were associated with GERD. She's been taking this medication at this dose since.   3) Anxiety and Depression: Much improved and feeling better since December/January 2017-2018. Her daughter doesn't believe she really needs this medication as she suspect her mother's depression was related to severe fluid retention from CHF. She reports feeling much better and would like to wean off of this medication.      Review of  Systems  Constitutional: Negative for fatigue.  Respiratory: Negative for shortness of breath.   Cardiovascular: Negative for chest pain and leg swelling.  Gastrointestinal:       Denies GERD symptoms  Neurological: Negative for dizziness and numbness.  Psychiatric/Behavioral:       Denies concerns for anxiety and depression       Past Medical History:  Diagnosis Date  . Anemia   . Cardiomyopathy, ischemic    a. 01/2016 Echo: EF 67%; b. 03/2016 Echo: EF 40%; c. 06/2016 Echo: EF 30-35%; d. 07/2016 Echo: EF 30-35%, MAC w/ mild MR, mod dil LA, mildly dil RA, mild TR, RVSP 93mHg.  .Marland KitchenCoronary artery disease    a. [place] LAD 99d, LCX 861mRCA 9932m5-80d (unsuccessful PCI);  b. 12/2015 CABGx2 ([place]): VG->OM3, VG->RCA;  c. 11/2016 Cath: LM 20, LAD 55m5md, D1 30, LCX 77m,28m, OM2/3 mod dzs, RCA 70p, 100m, 11m small, VG->RCA mildly ectatic, min irregs, VG->OM3 nl.  . Diabetes mellitus without complication [place]   . HFrEF (heart failure with reduced ejection fraction) (HCC)  [place]. 07/2016 Echo: EF 30-35%.  . Hypertension      Social History   Social History  . Marital status: Single    Spouse name: N/A  . Number of children: N/A  . Years of education: N/A   Occupational History  . Not on file.   Social History Main Topics  . Smoking status: Never Smoker  . Smokeless tobacco: Never Used  . Alcohol use No  . Drug  use: No  . Sexual activity: Not on file   Other Topics Concern  . Not on file   Social History Narrative   Single.   Moved from [place].   Now lives with daughter.     Past Surgical History:  Procedure Laterality Date  . BYPASS GRAFT ANGIOGRAPHY N/A 12/05/2016   Procedure: Bypass Graft Angiography;  Surgeon: Nelva Bush, MD;  Location: [facility];  Service: Cardiovascular;  Laterality: N/A;  . CARDIAC CATHETERIZATION    . CORONARY ANGIOPLASTY    . CORONARY ARTERY BYPASS GRAFT  12/2015   CABG x 2  . LEFT HEART CATH  AND CORONARY ANGIOGRAPHY N/A 12/05/2016   Procedure: Left Heart Cath and Coronary Angiography;  Surgeon: Nelva Bush, MD;  Location: [facility];  Service: Cardiovascular;  Laterality: N/A;    Family History  Problem Relation Age of Onset  . Heart attack Sister   . Arrhythmia Brother        s/p AICD  . Cardiomyopathy Brother   . Diabetes type II Brother     Allergies  Allergen Reactions  . Shellfish Allergy     Face swelling    Current Outpatient Prescriptions on File Prior to Visit  Medication Sig Dispense Refill  . aspirin EC 81 MG tablet Take 81 mg by mouth daily.    Marland Kitchen atorvastatin (LIPITOR) 20 MG tablet Take 20 mg by mouth daily.    . carvedilol (COREG) 12.5 MG tablet Take 1 tablet (12.5 mg total) by mouth 2 (two) times daily with a meal. 180 tablet 3  . enalapril (VASOTEC) 20 MG tablet Take 1 tablet (20 mg total) by mouth 2 (two) times daily. 180 tablet 2  . escitalopram (LEXAPRO) 20 MG tablet Take 1 tablet (20 mg total) by mouth daily. 90 tablet 1  . glipiZIDE (GLUCOTROL XL) 2.5 MG 24 hr tablet Take 1 tablet (2.5 mg total) by mouth daily with breakfast. 90 tablet 1  . glipiZIDE (GLUCOTROL) 5 MG tablet TAKE 1 TABLET (5 MG TOTAL) BY MOUTH 2 (TWO) TIMES DAILY BEFORE A MEAL. FOR DIABETES 180 tablet 1  . hydrALAZINE (APRESOLINE) 25 MG tablet Take 1 tablet (25 mg total) by mouth 3 (three) times daily. 270 tablet 3  . isosorbide mononitrate (IMDUR) 60 MG 24 hr tablet Take 1 tablet (60 mg total) by mouth daily. 90 tablet 3  . latanoprost (XALATAN) 0.005 % ophthalmic solution Place 1 drop into both eyes at bedtime.    . magnesium oxide (MAG-OX) 400 (241.3 Mg) MG tablet TAKE 1 TABLET (400 MG TOTAL) BY MOUTH DAILY. 90 tablet 1  . pantoprazole (PROTONIX) 40 MG tablet TAKE 1 TABLET (40 MG TOTAL) BY MOUTH DAILY. 90 tablet 1  . Potassium Chloride ER 20 MEQ TBCR Take 2 tablets (40 mEq) by mouth once a day. 60 tablet 3  . spironolactone (ALDACTONE) 25 MG tablet Take 1 tablet (25 mg  total) by mouth daily. 90 tablet 3  . torsemide (DEMADEX) 20 MG tablet Take 23m one time a day, alternating with 254mthe alternate days. 60 tablet 3  . atorvastatin (LIPITOR) 20 MG tablet Take 1 tablet (20 mg total) by mouth daily. 90 tablet 3   No current facility-administered medications on file prior to visit.     BP 110/60   Pulse 68   Ht 5' 2"  (1.575 m)   Wt 154 lb (69.9 kg)   SpO2 98%   BMI 28.17 kg/m    Objective:   Physical Exam  Constitutional: She is oriented to person, place, and time. She appears well-nourished.  Neck: Neck supple.  Cardiovascular: Normal rate and regular rhythm.   Pulmonary/Chest: Effort normal and breath sounds normal.  Neurological: She is alert and oriented to person, place, and time.  Skin: Skin is warm and dry.  Psychiatric: She has a normal mood and affect.          Assessment & Plan:

## 2017-02-27 NOTE — Assessment & Plan Note (Signed)
Based off of home readings diabetes seems uncontrolled. Suspected this would occur if we removed her from Lantus. Discussed that it would be best to resume Lantus as this did well for glucose control . Will restart lantus at 10 units HS. Hold Glipizide for now. Check A1C today. Will call in 2 weeks for glucose readings.   Long discussion regarding importance of diabetic diet as she gained weight through poor diet while in [place]. Will recheck A1C again before she leaves for [place] in October 2018.

## 2017-02-27 NOTE — Patient Instructions (Addendum)
Stop Glipizide tablets for now.  Start Lantus insulin. Inject 10 units into the skin every evening.   Continue to monitor your blood sugars, I will call you in 2 weeks to check on your readings.  Complete lab work prior to leaving today. I will notify you of your results once received.   It is important that you improve your diet. Please limit carbohydrates in the form of white bread, rice, pasta, sweets, fast food, fried food, sugary drinks, etc. Increase your consumption of fresh fruits and vegetables, whole grains, lean protein.  Wean off of ecitalopram 20 mg tabelts. Take 1/2 tablet daily for 10 days, then stop.   Stop pantoprazole 40 mg tablets for heart burn. Please call me if you experience heart burn when off of this medication.  Scheduled a follow up visit prior to leaving for [place].  Diabetes Mellitus and Food It is important for you to manage your blood sugar (glucose) level. Your blood glucose level can be greatly affected by what you eat. Eating healthier foods in the appropriate amounts throughout the day at about the same time each day will help you control your blood glucose level. It can also help slow or prevent worsening of your diabetes mellitus. Healthy eating may even help you improve the level of your blood pressure and reach or maintain a healthy weight. General recommendations for healthful eating and cooking habits include:  Eating meals and snacks regularly. Avoid going long periods of time without eating to lose weight.  Eating a diet that consists mainly of plant-based foods, such as fruits, vegetables, nuts, legumes, and whole grains.  Using low-heat cooking methods, such as baking, instead of high-heat cooking methods, such as deep frying.  Work with your dietitian to make sure you understand how to use the Nutrition Facts information on food labels. How can food affect me? Carbohydrates Carbohydrates affect your blood glucose level more than any other  type of food. Your dietitian will help you determine how many carbohydrates to eat at each meal and teach you how to count carbohydrates. Counting carbohydrates is important to keep your blood glucose at a healthy level, especially if you are using insulin or taking certain medicines for diabetes mellitus. Alcohol Alcohol can cause sudden decreases in blood glucose (hypoglycemia), especially if you use insulin or take certain medicines for diabetes mellitus. Hypoglycemia can be a life-threatening condition. Symptoms of hypoglycemia (sleepiness, dizziness, and disorientation) are similar to symptoms of having too much alcohol. If your health care provider has given you approval to drink alcohol, do so in moderation and use the following guidelines:  Women should not have more than one drink per day, and men should not have more than two drinks per day. One drink is equal to: ? 12 oz of beer. ? 5 oz of wine. ? 1 oz of hard liquor.  Do not drink on an empty stomach.  Keep yourself hydrated. Have water, diet soda, or unsweetened iced tea.  Regular soda, juice, and other mixers might contain a lot of carbohydrates and should be counted.  What foods are not recommended? As you make food choices, it is important to remember that all foods are not the same. Some foods have fewer nutrients per serving than other foods, even though they might have the same number of calories or carbohydrates. It is difficult to get your body what it needs when you eat foods with fewer nutrients. Examples of foods that you should avoid that are high in calories  and carbohydrates but low in nutrients include:  Trans fats (most processed foods list trans fats on the Nutrition Facts label).  Regular soda.  Juice.  Candy.  Sweets, such as cake, pie, doughnuts, and cookies.  Fried foods.  What foods can I eat? Eat nutrient-rich foods, which will nourish your body and keep you healthy. The food you should eat also  will depend on several factors, including:  The calories you need.  The medicines you take.  Your weight.  Your blood glucose level.  Your blood pressure level.  Your cholesterol level.  You should eat a variety of foods, including:  Protein. ? Lean cuts of meat. ? Proteins low in saturated fats, such as fish, egg whites, and beans. Avoid processed meats.  Fruits and vegetables. ? Fruits and vegetables that may help control blood glucose levels, such as apples, mangoes, and yams.  Dairy products. ? Choose fat-free or low-fat dairy products, such as milk, yogurt, and cheese.  Grains, bread, pasta, and rice. ? Choose whole grain products, such as multigrain bread, whole oats, and brown rice. These foods may help control blood pressure.  Fats. ? Foods containing healthful fats, such as nuts, avocado, olive oil, canola oil, and fish.  Does everyone with diabetes mellitus have the same meal plan? Because every person with diabetes mellitus is different, there is not one meal plan that works for everyone. It is very important that you meet with a dietitian who will help you create a meal plan that is just right for you. This information is not intended to replace advice given to you by your health care provider. Make sure you discuss any questions you have with your health care provider. Document Released: 04/10/2005 Document Revised: 12/20/2015 Document Reviewed: 06/10/2013 Elsevier Interactive Patient Education  2017 [employer]

## 2017-03-05 ENCOUNTER — Other Ambulatory Visit: Payer: Self-pay | Admitting: *Deleted

## 2017-03-05 ENCOUNTER — Other Ambulatory Visit
Admission: RE | Admit: 2017-03-05 | Discharge: 2017-03-05 | Disposition: A | Discharge status: Home / Self Care | Payer: Medicare Other | Source: Ambulatory Visit | Attending: Internal Medicine | Admitting: Internal Medicine

## 2017-03-05 DIAGNOSIS — I10 Essential (primary) hypertension: Secondary | ICD-10-CM

## 2017-03-05 DIAGNOSIS — Z79899 Other long term (current) drug therapy: Secondary | ICD-10-CM

## 2017-03-05 DIAGNOSIS — I5022 Chronic systolic (congestive) heart failure: Secondary | ICD-10-CM | POA: Insufficient documentation

## 2017-03-05 DIAGNOSIS — I5021 Acute systolic (congestive) heart failure: Secondary | ICD-10-CM

## 2017-03-05 LAB — BASIC METABOLIC PANEL
ANION GAP: 10 (ref 5–15)
BUN: 46 mg/dL — AB (ref 6–20)
CO2: 25 mmol/L (ref 22–32)
Calcium: 9.4 mg/dL (ref 8.9–10.3)
Chloride: 98 mmol/L — ABNORMAL LOW (ref 101–111)
Creatinine, Ser: 1.98 mg/dL — ABNORMAL HIGH (ref 0.44–1.00)
GFR calc Af Amer: 28 mL/min — ABNORMAL LOW (ref >60)
GFR calc non Af Amer: 24 mL/min — ABNORMAL LOW (ref >60)
GLUCOSE: 427 mg/dL — AB (ref 65–99)
POTASSIUM: 5.2 mmol/L — AB (ref 3.5–5.1)
Sodium: 133 mmol/L — ABNORMAL LOW (ref 135–145)

## 2017-03-06 ENCOUNTER — Ambulatory Visit (INDEPENDENT_AMBULATORY_CARE_PROVIDER_SITE_OTHER): Payer: Medicare Other

## 2017-03-06 ENCOUNTER — Other Ambulatory Visit: Payer: Self-pay

## 2017-03-06 DIAGNOSIS — I5022 Chronic systolic (congestive) heart failure: Secondary | ICD-10-CM

## 2017-03-06 DIAGNOSIS — I10 Essential (primary) hypertension: Secondary | ICD-10-CM

## 2017-03-11 ENCOUNTER — Encounter: Payer: Self-pay | Admitting: Primary Care

## 2017-03-12 ENCOUNTER — Other Ambulatory Visit: Payer: Self-pay | Admitting: Primary Care

## 2017-03-12 DIAGNOSIS — E119 Type 2 diabetes mellitus without complications: Secondary | ICD-10-CM

## 2017-03-12 DIAGNOSIS — Z794 Long term (current) use of insulin: Principal | ICD-10-CM

## 2017-03-12 MED ORDER — INSULIN GLARGINE 100 UNIT/ML SOLOSTAR PEN: 15.0000 [IU] | PEN_INJECTOR | Freq: Every evening | SUBCUTANEOUS | 11 refills | Status: DC

## 2017-03-16 ENCOUNTER — Other Ambulatory Visit
Admission: RE | Admit: 2017-03-16 | Discharge: 2017-03-16 | Disposition: A | Discharge status: Home / Self Care | Payer: Medicare Other | Source: Ambulatory Visit | Attending: Internal Medicine | Admitting: Internal Medicine

## 2017-03-16 DIAGNOSIS — I5022 Chronic systolic (congestive) heart failure: Secondary | ICD-10-CM | POA: Diagnosis present

## 2017-03-16 DIAGNOSIS — Z79899 Other long term (current) drug therapy: Secondary | ICD-10-CM | POA: Diagnosis present

## 2017-03-16 LAB — BASIC METABOLIC PANEL
ANION GAP: 10 (ref 5–15)
BUN: 32 mg/dL — AB (ref 6–20)
CHLORIDE: 98 mmol/L — AB (ref 101–111)
CO2: 27 mmol/L (ref 22–32)
Calcium: 8.9 mg/dL (ref 8.9–10.3)
Creatinine, Ser: 1.78 mg/dL — ABNORMAL HIGH (ref 0.44–1.00)
GFR calc Af Amer: 32 mL/min — ABNORMAL LOW (ref >60)
GFR, EST NON AFRICAN AMERICAN: 27 mL/min — AB (ref >60)
GLUCOSE: 284 mg/dL — AB (ref 65–99)
POTASSIUM: 3.8 mmol/L (ref 3.5–5.1)
Sodium: 135 mmol/L (ref 135–145)

## 2017-03-17 ENCOUNTER — Other Ambulatory Visit: Payer: Self-pay | Admitting: *Deleted

## 2017-03-17 DIAGNOSIS — I10 Essential (primary) hypertension: Secondary | ICD-10-CM

## 2017-03-17 DIAGNOSIS — Z79899 Other long term (current) drug therapy: Secondary | ICD-10-CM

## 2017-03-17 DIAGNOSIS — I5022 Chronic systolic (congestive) heart failure: Secondary | ICD-10-CM

## 2017-03-18 ENCOUNTER — Ambulatory Visit (INDEPENDENT_AMBULATORY_CARE_PROVIDER_SITE_OTHER)
Admission: RE | Admit: 2017-03-18 | Discharge: 2017-03-18 | Disposition: A | Discharge status: Home / Self Care | Payer: Medicare Other | Source: Ambulatory Visit | Attending: Primary Care | Admitting: Primary Care

## 2017-03-18 ENCOUNTER — Encounter: Payer: Self-pay | Admitting: Primary Care

## 2017-03-18 ENCOUNTER — Ambulatory Visit (INDEPENDENT_AMBULATORY_CARE_PROVIDER_SITE_OTHER): Payer: Medicare Other | Admitting: Primary Care

## 2017-03-18 ENCOUNTER — Other Ambulatory Visit: Payer: Self-pay | Admitting: Primary Care

## 2017-03-18 VITALS — BP 126/78 | HR 61 | Temp 98.0°F | Ht 62.0 in | Wt 156.4 lb

## 2017-03-18 DIAGNOSIS — M545 Low back pain, unspecified: Secondary | ICD-10-CM

## 2017-03-18 DIAGNOSIS — M25559 Pain in unspecified hip: Secondary | ICD-10-CM | POA: Diagnosis not present

## 2017-03-18 DIAGNOSIS — I255 Ischemic cardiomyopathy: Secondary | ICD-10-CM | POA: Diagnosis not present

## 2017-03-18 DIAGNOSIS — S32030A Wedge compression fracture of third lumbar vertebra, initial encounter for closed fracture: Secondary | ICD-10-CM

## 2017-03-18 MED ORDER — TIZANIDINE HCL 4 MG PO TABS: 4.0000 mg | ORAL_TABLET | Freq: Three times a day (TID) | ORAL | 0 refills | Status: DC | PRN

## 2017-03-18 NOTE — Progress Notes (Signed)
Subjective:    Patient ID: Megan Howard, female    DOB: 22-Oct-1945, 71 y.o.   MRN: [id number]  HPI  Megan Howard is a 71 year old female who presents today with a chief complaint of back pain. She was walking in her garage six days ago, accidentally tripped over the bottom door frame. She fell forward landing onto her hands and left side. She's experienced pain to the left lower back and bilateral hips since. She's been taking Tylenol as needed for pain with temporary improvement. She's been trying to stay active to prevent stiffness. She's also applied BioFreeze, soaking in the bathtub. Yesterday she went out walking and noticed a lot of pain last night and this morning.  She denies headaches, dizziness, numbness/tingling, weakness.  Review of Systems  Musculoskeletal: Positive for arthralgias and back pain.  Skin: Negative for color change.  Neurological: Negative for dizziness, weakness and headaches.       Past Medical History:  Diagnosis Date  . Anemia   . Cardiomyopathy, ischemic    a. 01/2016 Echo: EF 67%; b. 03/2016 Echo: EF 40%; c. 06/2016 Echo: EF 30-35%; d. 07/2016 Echo: EF 30-35%, MAC w/ mild MR, mod dil LA, mildly dil RA, mild TR, RVSP 76mHg.  .Marland KitchenCoronary artery disease    a. [place] LAD 99d, LCX 830mRCA 9959m5-80d (unsuccessful PCI);  b. 12/2015 CABGx2 ([place]): VG->OM3, VG->RCA;  c. 11/2016 Cath: LM 20, LAD 29m17md, D1 30, LCX 73m,38m, OM2/3 mod dzs, RCA 70p, 100m, 74m small, VG->RCA mildly ectatic, min irregs, VG->OM3 nl.  . Diabetes mellitus without complication (HCC)  [place]   . HFrEF (heart failure with reduced ejection fraction) (HCC)  [place]. 07/2016 Echo: EF 30-35%.  . Hypertension      Social History   Social History  . Marital status: Single    Spouse name: N/A  . Number of children: N/A  . Years of education: N/A   Occupational History  . Not on file.   Social History Main Topics  . Smoking status: Never Smoker  . Smokeless  tobacco: Never Used  . Alcohol use No  . Drug use: No  . Sexual activity: Not on file   Other Topics Concern  . Not on file   Social History Narrative   Single.   [place]     Past Surgical History:  Procedure Laterality Date  . BYPASS GRAFT ANGIOGRAPHY N/A 12/05/2016   Procedure: Bypass Graft Angiography;  Surgeon: End, CNelva Bush [facility];  Service: Cardiovascular;  Laterality: N/A;  . CARDIAC CATHETERIZATION    . CORONARY ANGIOPLASTY    . CORONARY ARTERY BYPASS GRAFT  12/2015   CABG x 2  . LEFT HEART CATH AND CORONARY ANGIOGRAPHY N/A 12/05/2016   Procedure: Left Heart Cath and Coronary Angiography;  Surgeon: End, CNelva Bush [facility];  Service: Cardiovascular;  Laterality: N/A;    Family History  Problem Relation Age of Onset  . Heart attack Sister   . Arrhythmia Brother        s/p AICD  . Cardiomyopathy Brother   . Diabetes type II Brother     Allergies  Allergen Reactions  . Shellfish Allergy     Face swelling    Current Outpatient Prescriptions on File Prior to Visit  Medication Sig Dispense Refill  . aspirin EC 81 MG tablet Take 81 mg by  mouth daily.    Marland Kitchen atorvastatin (LIPITOR) 20 MG tablet Take 1 tablet (20 mg total) by mouth daily. 90 tablet 3  . carvedilol (COREG) 12.5 MG tablet Take 1 tablet (12.5 mg total) by mouth 2 (two) times daily with a meal. 180 tablet 3  . enalapril (VASOTEC) 20 MG tablet Take 1 tablet (20 mg total) by mouth 2 (two) times daily. 180 tablet 2  . glipiZIDE (GLUCOTROL XL) 2.5 MG 24 hr tablet Take 1 tablet (2.5 mg total) by mouth daily with breakfast. 90 tablet 1  . glipiZIDE (GLUCOTROL) 5 MG tablet TAKE 1 TABLET (5 MG TOTAL) BY MOUTH 2 (TWO) TIMES DAILY BEFORE A MEAL. FOR DIABETES 180 tablet 1  . hydrALAZINE (APRESOLINE) 25 MG tablet Take 1 tablet (25 mg total) by mouth 3 (three) times daily. 270 tablet 3  . Insulin Glargine (LANTUS) 100 UNIT/ML  Solostar Pen Inject 15 Units into the skin every evening. 15 mL 11  . Insulin Pen Needle (PEN NEEDLES 3/16") 31G X 5 MM MISC Use every morning 30 each 5  . isosorbide mononitrate (IMDUR) 60 MG 24 hr tablet Take 1 tablet (60 mg total) by mouth daily. 90 tablet 3  . latanoprost (XALATAN) 0.005 % ophthalmic solution Place 1 drop into both eyes at bedtime.    . magnesium oxide (MAG-OX) 400 (241.3 Mg) MG tablet TAKE 1 TABLET (400 MG TOTAL) BY MOUTH DAILY. 90 tablet 1  . Potassium Chloride ER 20 MEQ TBCR Take 2 tablets (40 mEq) by mouth once a day. 60 tablet 3  . torsemide (DEMADEX) 20 MG tablet Take 12m one time a day, alternating with 22mthe alternate days. 60 tablet 3   No current facility-administered medications on file prior to visit.     BP 126/78   Pulse 61   Temp 98 F (36.7 C) (Oral)   Ht 5' 2"  (1.575 m)   Wt 156 lb 6.4 oz (70.9 kg)   SpO2 98%   BMI 28.61 kg/m    Objective:   Physical Exam  Constitutional: She appears well-nourished.  Neck: Neck supple.  Cardiovascular: Normal rate.   Pulmonary/Chest: Effort normal.  Musculoskeletal:       Lumbar back: She exhibits decreased range of motion and pain. She exhibits no tenderness.       Back:  Skin: Skin is warm and dry.          Assessment & Plan:  Acute Back Pain:  Also with bilateral hip pain since fall 6 days ago. Exam today overall stable, very uncomfortable in the office. Suspect more arthritic flare but will check plain films given trauma. Given little improvement with tylenol, rest, heating pad will treat with low dose muscle relaxer. Drowsiness precautions provided.  Discussed that symptoms may last several more days, should be improving by this weekend. Will await xray results.  ClSheral FlowNP

## 2017-03-18 NOTE — Patient Instructions (Signed)
Complete xray(s) prior to leaving today. I will notify you of your results once received.  You may take Tizanidine muscle relaxer tablets every 8 hours as needed for muscle spasms/back pain. Caution as this medication may cause drowsiness.   It was a pleasure to see you today!

## 2017-03-28 ENCOUNTER — Other Ambulatory Visit: Payer: Self-pay | Admitting: Primary Care

## 2017-03-31 ENCOUNTER — Encounter: Payer: Self-pay | Admitting: Primary Care

## 2017-03-31 NOTE — Telephone Encounter (Signed)
Ok to refill? Electronically refill request for escitalopram (LEXAPRO) 20 MG tablet   Last prescribed on 09/10/2016. It was taken off medication list and was seen on 02/27/2017.

## 2017-04-17 ENCOUNTER — Telehealth: Payer: Self-pay | Admitting: Internal Medicine

## 2017-04-17 NOTE — Telephone Encounter (Signed)
Informed Terri that pt is due for repeat BMET this week. [facility] next week's appt w/Dr. End.

## 2017-04-17 NOTE — Telephone Encounter (Signed)
Pt daughter calling stating pt is to come in on Wednesday to see Dr End Would like to know if there is blood work that needs to be done prior to that  Please advise

## 2017-04-20 ENCOUNTER — Other Ambulatory Visit
Admission: RE | Admit: 2017-04-20 | Discharge: 2017-04-20 | Disposition: A | Discharge status: Home / Self Care | Payer: Medicare Other | Source: Ambulatory Visit | Attending: Internal Medicine | Admitting: Internal Medicine

## 2017-04-20 DIAGNOSIS — Z79899 Other long term (current) drug therapy: Secondary | ICD-10-CM | POA: Diagnosis present

## 2017-04-20 DIAGNOSIS — I10 Essential (primary) hypertension: Secondary | ICD-10-CM | POA: Insufficient documentation

## 2017-04-20 DIAGNOSIS — I5022 Chronic systolic (congestive) heart failure: Secondary | ICD-10-CM | POA: Diagnosis present

## 2017-04-20 LAB — BASIC METABOLIC PANEL
Anion gap: 11 (ref 5–15)
BUN: 33 mg/dL — AB (ref 6–20)
CALCIUM: 9.2 mg/dL (ref 8.9–10.3)
CO2: 26 mmol/L (ref 22–32)
CREATININE: 1.66 mg/dL — AB (ref 0.44–1.00)
Chloride: 94 mmol/L — ABNORMAL LOW (ref 101–111)
GFR calc Af Amer: 34 mL/min — ABNORMAL LOW (ref >60)
GFR calc non Af Amer: 30 mL/min — ABNORMAL LOW (ref >60)
GLUCOSE: 421 mg/dL — AB (ref 65–99)
Potassium: 4.4 mmol/L (ref 3.5–5.1)
Sodium: 131 mmol/L — ABNORMAL LOW (ref 135–145)

## 2017-04-22 ENCOUNTER — Encounter: Payer: Self-pay | Admitting: Internal Medicine

## 2017-04-22 ENCOUNTER — Ambulatory Visit (INDEPENDENT_AMBULATORY_CARE_PROVIDER_SITE_OTHER): Payer: Medicare Other | Admitting: Internal Medicine

## 2017-04-22 ENCOUNTER — Telehealth: Payer: Self-pay | Admitting: Internal Medicine

## 2017-04-22 VITALS — BP 156/84 | HR 67 | Ht 62.0 in | Wt 159.5 lb

## 2017-04-22 DIAGNOSIS — E785 Hyperlipidemia, unspecified: Secondary | ICD-10-CM

## 2017-04-22 DIAGNOSIS — I10 Essential (primary) hypertension: Secondary | ICD-10-CM

## 2017-04-22 DIAGNOSIS — Z79899 Other long term (current) drug therapy: Secondary | ICD-10-CM

## 2017-04-22 DIAGNOSIS — R21 Rash and other nonspecific skin eruption: Secondary | ICD-10-CM

## 2017-04-22 DIAGNOSIS — I5022 Chronic systolic (congestive) heart failure: Secondary | ICD-10-CM | POA: Diagnosis not present

## 2017-04-22 DIAGNOSIS — I2510 Atherosclerotic heart disease of native coronary artery without angina pectoris: Secondary | ICD-10-CM

## 2017-04-22 MED ORDER — SACUBITRIL-VALSARTAN 49-51 MG PO TABS: 1.0000 | ORAL_TABLET | Freq: Two times a day (BID) | ORAL | 3 refills | Status: AC

## 2017-04-22 NOTE — Telephone Encounter (Signed)
Returned call to patient. She called the patient assistance for [facility]. They will cover 3 months of medication for patient until she can complete the application process. She has already received approval for pre-screening process. She is going to fill out her portion of paperwork and I will have Dr. Okey Dupre fill out his portion to fax. Samples of Entresto along with 30-day free card left at front desk for patient to pick up. She verbalized understanding to [facility] on 05/08/17 for lab work and to stop Enalapril 36 hours prior to starting first dose of Entresto. Routing to Dr. Okey Dupre to make him aware.   Medication Samples have been provided to the patient.  Drug name: Sherryll Burger       Strength: 49-51mg         Qty: 2 bottles(28 tablets)  LOT: [id number]  Exp.Date: October 2019

## 2017-04-22 NOTE — Patient Instructions (Signed)
Medication Instructions:  Your physician recommends that you continue on your current medications as directed. Please refer to the Current Medication list given to you today.  A prescription for Entresto 49-51 mg (1 tablet) by mouth two times a day has been sent into your pharmacy. Please call our office to let us know if you're able to afford the medication.  If you are able to start the Entresto, You will need discontinue your Enalapril and wait for 3 days after stopping Enalapril to start the [facility]. You would also need lab work in about 2 weeks after starting Entresto.   Please call our office for further instructions either way to make sure you have all the information you need for making the switch to [facility].  If you do not start Entresto, Please call our office as Dr. Okey Dupre may make another medication change as well.  Labwork: none  Testing/Procedures: none  Follow-Up: Your physician recommends that you schedule a follow-up appointment in: 3 MONTHS WITH DR END.   If you need a refill on your cardiac medications before your next appointment, please call your pharmacy.

## 2017-04-22 NOTE — Telephone Encounter (Signed)
Pt states she passed her pre screening for [facility] and she wanted to let us know so the paperwork can get started. Please call.

## 2017-04-22 NOTE — Progress Notes (Signed)
Follow-up Outpatient Visit Date: 04/22/2017  Primary Care Provider: Pleas Koch, NP [place] [address]  Chief Complaint: Elevated blood pressure  HPI:  Megan Howard is a 71 y.o. year-old female with history of coronary artery disease status post CABG in 12/2015, ischemic cardiomyopathy, postoperative atrial fibrillation, type 2 diabetes mellitus, chronic kidney disease stage III, and GERD, who presents for follow-up of hypertension and chronic systolic heart failure. I last saw Megan Howard in the late July, at which time she was doing relatively well. Today, her main complaint is of a pruritic rash on the dorsum of her right hand. She has tried Aquaphor without relief. Her blood pressure is also been somewhat elevated. She is trying to watch what she eats but frequently consumes salty foods such as sausage or biscuits. She denies edema but has been gaining some weight, which her daughter believes is due to her dietary choices and increased appetite. She has not had any shortness of breath, orthopnea, PND, palpitations or lightheadedness. However she has exertional dyspnea that causes her to stop after walking about a quarter mile. She has noted a few episodes of a "sharp stinging pain" in the left armpit and left side of the chest. The pain is happened a few times over the last week and only lasts for a second or 2. It has always occurred at rest. She does not have any exertional discomfort.Marland Kitchen  --------------------------------------------------------------------------------------------------  Cardiovascular History & Procedures: Cardiovascular Problems:  Coronary artery disease  Ischemic cardiomyopathy  Postoperative atrial fibrillation  Risk Factors:  Known coronary artery disease, diabetes mellitus, family history, and age greater than 53  Cath/PCI:  LHC (12/05/16): LMCA with 20% disease. LAD with 20% proximal and 80% apical stenosis. Moderate D1 with 30%  proximal stenosis. LCx with 70% mid vessel disease and diffuse stenosis of small OM branch. 40% distal LAD stenosis also present. Dominant RCA with 70% proximal and 100% mid vessel stenoses. Patent SVG to OM and SVG to rPDA. SVG to rPDA with mild diffuse disease.  LHC (12/29/15, [facility]): LMCA with mild luminal irregularities. LAD with mild proximal disease and 99% apical stenosis. Codominant LCx with 80% mid vessel stenosis. Small, codominant RCA with 99% mid and 75-80% distal stenoses. Unsuccessful PCI to mid RCA due to inability to expand stenosis and deploy stent. 80% and 90% residual stenoses remain. LVEDP 21 mmHg. LVEF 45% with inferior hypokinesis.  CV Surgery:  CABG (12/2015 in [place]): SVG to OM2 and SVG to RCA.  EP Procedures and Devices:  Holter monitor (2017): Reportedly no evidence of atrial fibrillation.  Non-Invasive Evaluation(s):  Limited TTE (08/19/16): Normal LV size with mild LVH. LVEF 30-35%. MAC with mild MR present. LA moderately dilated. RA mildly dilated. Mild TR. Mild-to moderate pulmonary hypertension (RVSP 42 mmHg).  TTE (07/03/16): Mildly dilated LV with severely reduced contraction (EF 30-35%). There is diffuse hypokinesis with severe hypokinesis of the anterior and anteroseptal walls. 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 There is mild left atrial enlargement. RV size is moderately enlarged with moderately reduced function. There is moderate TR. Moderate pulmonary hypertension also evident.  TTE (04/20/16): Mild LVH with LVEF 40% and moderate global hypokinesis. Restrictive filling pattern noted. Decreased RV contraction with evidence of RV volume overload. Aortic sclerosis. Mild mitral and moderate tricuspid regurgitation. Moderate to severe pulmonary hypertension.  TTE (02/13/16): Normal LV size and function with LVEF of 67%. Mild LVH present. 2+ tricuspid and 1+ pulmonic regurgitation present. Mild pulmonary  hypertension.  Recent CV  Pertinent Labs: Lab Results  Component Value Date   CHOL 105 12/05/2016   CHOL 135 08/01/2016   HDL 28 (L) 12/05/2016   HDL 25 (L) 08/01/2016   LDLCALC 56 12/05/2016   LDLCALC 81 08/01/2016   TRIG 103 12/05/2016   CHOLHDL 3.8 12/05/2016   INR 1.12 12/03/2016   BNP >4,500.0 (H) 06/25/2016   K 4.4 04/20/2017   BUN 33 (H) 04/20/2017   BUN 23 02/18/2017   CREATININE 1.66 (H) 04/20/2017    Past medical and surgical history were reviewed and updated in EPIC.  Current Meds  Medication Sig  . aspirin EC 81 MG tablet Take 81 mg by mouth daily.  Marland Kitchen atorvastatin (LIPITOR) 20 MG tablet Take 1 tablet (20 mg total) by mouth daily.  . carvedilol (COREG) 12.5 MG tablet Take 1 tablet (12.5 mg total) by mouth 2 (two) times daily with a meal.  . enalapril (VASOTEC) 20 MG tablet Take 1 tablet (20 mg total) by mouth 2 (two) times daily.  Marland Kitchen glipiZIDE (GLUCOTROL XL) 2.5 MG 24 hr tablet Take 1 tablet (2.5 mg total) by mouth daily with breakfast.  . glipiZIDE (GLUCOTROL) 5 MG tablet TAKE 1 TABLET (5 MG TOTAL) BY MOUTH 2 (TWO) TIMES DAILY BEFORE A MEAL. FOR DIABETES  . hydrALAZINE (APRESOLINE) 25 MG tablet Take 1 tablet (25 mg total) by mouth 3 (three) times daily.  . Insulin Glargine (LANTUS) 100 UNIT/ML Solostar Pen Inject 15 Units into the skin every evening.  . Insulin Pen Needle (PEN NEEDLES 3/16") 31G X 5 MM MISC Use every morning  . isosorbide mononitrate (IMDUR) 60 MG 24 hr tablet Take 1 tablet (60 mg total) by mouth daily.  Marland Kitchen latanoprost (XALATAN) 0.005 % ophthalmic solution Place 1 drop into both eyes at bedtime.  . magnesium oxide (MAG-OX) 400 (241.3 Mg) MG tablet TAKE 1 TABLET (400 MG TOTAL) BY MOUTH DAILY.  . pantoprazole (PROTONIX) 40 MG tablet Take 40 mg by mouth daily.  . Potassium Chloride ER 20 MEQ TBCR Take 2 tablets (40 mEq) by mouth once a day.  Marland Kitchen tiZANidine (ZANAFLEX) 4 MG tablet Take 1 tablet (4 mg total) by mouth every 8 (eight) hours as needed for muscle spasms.  Marland Kitchen torsemide  (DEMADEX) 20 MG tablet Take 31m one time a day, alternating with 266mthe alternate days.  . [DISCONTINUED] spironolactone (ALDACTONE) 25 MG tablet Take 25 mg by mouth daily.    Allergies: Shellfish allergy  Social History   Social History  . Marital status: Single    Spouse name: N/A  . Number of children: N/A  . Years of education: N/A   Occupational History  . Not on file.   Social History Main Topics  . Smoking status: Never Smoker  . Smokeless tobacco: Never Used  . Alcohol use No  . Drug use: No  . Sexual activity: Not on file   Other Topics Concern  . Not on file   Social History Narrative   Single.   [place]  [place].     Family History  Problem Relation Age of Onset  . Heart attack Sister   . Arrhythmia Brother        s/p AICD  . Cardiomyopathy Brother   . Diabetes type II Brother     Review of Systems: Sometimes hears "clicking in my head." A 12-system review of systems was performed and was negative except as noted in the HPI.  --------------------------------------------------------------------------------------------------  Physical Exam: BP (!) 156/84 (BP Location: Left Arm, Patient Position: Sitting, Cuff Size: Normal)   Pulse 67   Ht _0  (1.575 m)   Wt 159 lb 8 oz (72.3 kg)   BMI 29.17 kg/m   General:  Overweight woman, seated comfortably in the exam room. She is accompanied by her daughter. HEENT: No conjunctival pallor or scleral icterus. Moist mucous membranes.  OP clear. Neck: Supple without lymphadenopathy, thyromegaly, JVD, or HJR.  Lungs: Normal work of breathing. Clear to auscultation bilaterally without wheezes or crackles. Heart: Regular rate and rhythm without murmurs, rubs, or gallops. Non-displaced PMI. Abd: Bowel sounds present. Soft, NT/ND without hepatosplenomegaly Ext: No lower extremity edema. 2+ radial pulses bilaterally. Skin: Warm and dry. There is an area of excoriation and scaling  along the dorsum of the hands.  Lab Results  Component Value Date   WBC 4.0 12/03/2016   HGB 11.1 (L) 12/03/2016   HCT 34.0 (L) 12/03/2016   MCV 83.6 12/03/2016   PLT 192 12/03/2016    Lab Results  Component Value Date   NA 131 (L) 04/20/2017   K 4.4 04/20/2017   CL 94 (L) 04/20/2017   CO2 26 04/20/2017   BUN 33 (H) 04/20/2017   CREATININE 1.66 (H) 04/20/2017   GLUCOSE 421 (H) 04/20/2017   ALT 14 11/24/2016    Lab Results  Component Value Date   CHOL 105 12/05/2016   HDL 28 (L) 12/05/2016   LDLCALC 56 12/05/2016   TRIG 103 12/05/2016   CHOLHDL 3.8 12/05/2016    --------------------------------------------------------------------------------------------------  ASSESSMENT AND PLAN: Chronic systolic heart failure Megan Howard has continued to gain weight but appears euvolemic on exam. She is well compensated with NYHA class II symptoms. She was been intolerant of spironolactone. We will continue her current dose of carvedilol. Given her elevated blood pressure we discussed escalation of hydralazine versus switching from enalapril to [facility]; if affordability allows, we will do the latter. We will therefore continue her current doses of hydralazine and isosorbide mononitrate.  Coronary artery disease without angina Recent sharp, staying left-sided chest pains are atypical and unlikely to be related to worsening coronary insufficiency. We will continue current medications for secondary prevention.  Hypertension Blood pressure is suboptimally controlled today. As above, we will switch from enalapril to [facility] 49/51. I will have Megan Howard return in about 2 weeks for a basic metabolic panel and blood pressure check. Based on these results, Delene Loll could be maximized, if tolerated.  Hyperlipidemia LDL was 56 in May. Continue current dose of atorvastatin.  Hand rash No sign of infection. Continue Aquaphor and follow-up with PCP for further evaluation.  Follow-up: Return  to clinic in 3 months.  Nelva Bush, MD 04/22/2017 10:43 AM

## 2017-04-22 NOTE — Telephone Encounter (Signed)
Thank you for the update. Paperwork has been completed. When patient has received her medication supply, she should discontinue enalapril and start Entresto 36 hours later. She should come back 1-2 weeks after starting Entresto for a BMP and BP check. Thanks.

## 2017-04-23 ENCOUNTER — Encounter: Payer: Self-pay | Admitting: Internal Medicine

## 2017-04-23 NOTE — Telephone Encounter (Signed)
Received fax from [employer] Patient Assistance. Need entresto prescription faxed.  Paperwork in [place] [facility] in box

## 2017-04-24 NOTE — Telephone Encounter (Signed)
Dr. Okey Dupre completed physician portion of application and I faxed to [employer]. Patient and daughter notified and verbalized understanding.

## 2017-04-29 ENCOUNTER — Telehealth: Payer: Self-pay | Admitting: *Deleted

## 2017-04-29 ENCOUNTER — Other Ambulatory Visit: Payer: Self-pay | Admitting: Physician Assistant

## 2017-04-29 NOTE — Telephone Encounter (Signed)
Prior Authorization for [employer] submitted via [email or web address]. [id number]. Awaiting response.

## 2017-04-30 ENCOUNTER — Telehealth: Payer: Self-pay

## 2017-04-30 NOTE — Telephone Encounter (Signed)
Spoken to patient. Patient stated that she does take as prescribed 15 units at bedtime but when her sugar is up in the 300s to 400s, patient would inject another 15 units.  Patient stated that she checks her fasting sugar in the mornings. The readings she gave me are 208, 290, and the highest was on Tuesday at 424.

## 2017-04-30 NOTE — Telephone Encounter (Signed)
Received fax from [facility] that Entresto 49-51 mg approved with coverage good until 05/01/2019. Member ID [id number]

## 2017-04-30 NOTE — Telephone Encounter (Signed)
Message left for patient to return my call.  

## 2017-04-30 NOTE — Telephone Encounter (Signed)
Spoke with patient's daughter who didn't know about this. Daughter will review blood sugar logs and report back to [place] later this evening after work. Sounds like we need to increase Lantus but will await for glucose numbers.

## 2017-04-30 NOTE — Telephone Encounter (Signed)
Pt left v/m; pt cannot get refills on lantus; pt request cb about pt taking more lantus than what is on prescription so pt can get refills. I tried to call pt and no answer and v/m not set up.

## 2017-05-07 ENCOUNTER — Ambulatory Visit: Payer: Medicare Other | Admitting: Primary Care

## 2017-05-11 ENCOUNTER — Telehealth: Payer: Self-pay | Admitting: Internal Medicine

## 2017-05-11 ENCOUNTER — Telehealth: Payer: Self-pay

## 2017-05-11 ENCOUNTER — Other Ambulatory Visit
Admission: RE | Admit: 2017-05-11 | Discharge: 2017-05-11 | Disposition: A | Discharge status: Home / Self Care | Payer: Medicare Other | Source: Ambulatory Visit | Attending: Internal Medicine | Admitting: Internal Medicine

## 2017-05-11 DIAGNOSIS — I10 Essential (primary) hypertension: Secondary | ICD-10-CM | POA: Insufficient documentation

## 2017-05-11 DIAGNOSIS — Z79899 Other long term (current) drug therapy: Secondary | ICD-10-CM | POA: Insufficient documentation

## 2017-05-11 LAB — BASIC METABOLIC PANEL
Anion gap: 12 (ref 5–15)
BUN: 45 mg/dL — AB (ref 6–20)
CO2: 26 mmol/L (ref 22–32)
CREATININE: 1.85 mg/dL — AB (ref 0.44–1.00)
Calcium: 9.6 mg/dL (ref 8.9–10.3)
Chloride: 94 mmol/L — ABNORMAL LOW (ref 101–111)
GFR calc Af Amer: 30 mL/min — ABNORMAL LOW (ref >60)
GFR, EST NON AFRICAN AMERICAN: 26 mL/min — AB (ref >60)
GLUCOSE: 536 mg/dL — AB (ref 65–99)
Potassium: 4.5 mmol/L (ref 3.5–5.1)
SODIUM: 132 mmol/L — AB (ref 135–145)

## 2017-05-11 NOTE — Telephone Encounter (Signed)
Received call from [facility] lab. Pt had BMET today ordered by Dr. Okey Dupre due to [facility] start. Glucose 536.Pt takes glipizide, lantus and AM insulin. I have notified Lewanda Rife, LPN, at [employer] at [facility] who asks I s/w their lab. I reviewed glucose of 536 with Araceli who states she will notify Vernona Rieger, NP. Reviewed with Ward Givens, NP, who is aware and agreeable w/notifications.

## 2017-05-11 NOTE — Telephone Encounter (Signed)
Received critical results on glucose 536, verbally notified Vernona Rieger.

## 2017-05-12 ENCOUNTER — Encounter: Payer: Self-pay | Admitting: Primary Care

## 2017-05-12 ENCOUNTER — Telehealth: Payer: Self-pay | Admitting: Primary Care

## 2017-05-12 DIAGNOSIS — E119 Type 2 diabetes mellitus without complications: Secondary | ICD-10-CM

## 2017-05-12 DIAGNOSIS — Z794 Long term (current) use of insulin: Principal | ICD-10-CM

## 2017-05-12 MED ORDER — INSULIN PEN NEEDLE 32G X 4 MM MISC: 5 refills | Status: AC

## 2017-05-12 MED ORDER — TORSEMIDE 20 MG PO TABS: 20.0000 mg | ORAL_TABLET | Freq: Every day | ORAL | 1 refills | Status: DC

## 2017-05-12 MED ORDER — INSULIN ASPART 100 UNIT/ML FLEXPEN: PEN_INJECTOR | SUBCUTANEOUS | 5 refills | Status: AC

## 2017-05-12 MED ORDER — INSULIN GLARGINE 100 UNIT/ML SOLOSTAR PEN: 22.0000 [IU] | PEN_INJECTOR | Freq: Every evening | SUBCUTANEOUS | 5 refills | Status: AC

## 2017-05-12 NOTE — Addendum Note (Signed)
Addended by: Stann Mainland on: 05/12/2017 09:27 AM   Modules accepted: Orders

## 2017-05-12 NOTE — Telephone Encounter (Signed)
Daughter returning call

## 2017-05-12 NOTE — Telephone Encounter (Signed)
Pen Needles sent to pharmacy.

## 2017-05-12 NOTE — Telephone Encounter (Signed)
Received phone call regarding patient's blood sugar of 536 on 05/11/17. Spoke with patient who is asymptomatic and feeling well. Home blood sugars fasting ranging from 300-400's mostly. Her Lantus was increased to 22 units on 05/02/17. Given her significant history of heart disease, will need to treat diabetes more aggressively. She will need meal coverage insulin and will add in Novolog 10 units TID at meals for blood sugars greater than 150. Continue Lantus at 22 units HS. Stop Glipizide. Patient was notified of these instructions and will follow up as scheduled on 05/13/17.  Johny Drilling, will you please send in a smaller size pen needles. Patient prefers size 4.

## 2017-05-12 NOTE — Telephone Encounter (Signed)
Patient and daughter notified of lab results from 05/11/17. They verbalized understanding to take torsemide 20 mg once a day and if she gains more than 2 pounds in a day or 5 pounds in a week, she can take a second dose of torsemide that day. Patient has already spoke to PCP about blood sugar and has an appointment tomorrow.

## 2017-05-13 ENCOUNTER — Encounter: Payer: Self-pay | Admitting: Primary Care

## 2017-05-13 ENCOUNTER — Ambulatory Visit (INDEPENDENT_AMBULATORY_CARE_PROVIDER_SITE_OTHER): Payer: Medicare Other | Admitting: Primary Care

## 2017-05-13 DIAGNOSIS — E1159 Type 2 diabetes mellitus with other circulatory complications: Secondary | ICD-10-CM

## 2017-05-13 DIAGNOSIS — I255 Ischemic cardiomyopathy: Secondary | ICD-10-CM | POA: Diagnosis not present

## 2017-05-13 NOTE — Assessment & Plan Note (Signed)
Continue Lantus 22 units HS.  Add in Novolog 10 units TID before meals for sugars greater than 130. Discussed that she needs to check sugars at least three times daily before meals. Stop Glipizide. Long discussion today regarding diabetic diet, several handouts provided. Will have [place] will be there for the next 3-6 months.  Request that they recheck A1C in 2 months from now. Also request they send her to a diabetes nutritionist.  Discussed to call if blood sugars below 100.  She will schedule an appointment with us [facility]

## 2017-05-13 NOTE — Progress Notes (Signed)
Subjective:    Patient ID: Megan Howard, female    DOB: Jun 08, 1946, 71 y.o.   MRN: [id number]  HPI  Megan Howard is a 71 year old female who presents today for follow up of diabetes.  Current medications include: Glipizide 7.5 mg, Lantus 22 units (increased several weeks ago).  She is checking her blood glucose once daily fasting in the morning and is getting readings of: 300-400's. This has not changed since her Lantus was increased to 22 units.  Last A1C: 10.1 in August 2018 Last Eye Exam: Completed in April 2018 Last Foot Exam: Today ACE/ARB: Entresto Statin: Atorvastatin   Diet currently consists of:  Breakfast: Banana, tuna, grits, eggs, sausage Lunch: Tuna, sandwiches, red beans and rice, corn bread Dinner: Red beans and rice, meat, some vegetables (cabbage), pinto beans Snacks: Graham crackers with cheese, fruit (apples and bananas), chips Desserts: Candy, cakes/cookies/pies every Sunday Beverages: Coffee, lemonade with sugar (just started making with Stevia), regular soda   Exercise: She walks three times weekly 30-45 minutes at a time.      Review of Systems  Constitutional: Negative for fatigue.  Respiratory: Negative for shortness of breath.   Cardiovascular: Negative for chest pain and leg swelling.  Endocrine: Negative for polyuria.  Neurological: Negative for dizziness and numbness.       Past Medical History:  Diagnosis Date  . Anemia   . Cardiomyopathy, ischemic    a. 01/2016 Echo: EF 67%; b. 03/2016 Echo: EF 40%; c. 06/2016 Echo: EF 30-35%; d. 07/2016 Echo: EF 30-35%, MAC w/ mild MR, mod dil LA, mildly dil RA, mild TR, RVSP 40mHg.  .Marland KitchenCoronary artery disease    a. [place] LAD 99d, LCX 854mRCA 9991m5-80d (unsuccessful PCI);  b. 12/2015 CABGx2 ([place] VG->OM3, VG->RCA;  c. 11/2016 Cath: LM 20, LAD 45m79md, D1 30, LCX 60m,33m, OM2/3 mod dzs, RCA 70p, 100m, 82m small, VG->RCA mildly ectatic, min irregs, VG->OM3 nl.  . Diabetes mellitus  without complication (HCC)  LinneusDyslipidemia   . HFrEF (heart failure with reduced ejection fraction) (HCC)  Megan Howard. 07/2016 Echo: EF 30-35%.  . Hypertension      Social History   Social History  . Marital status: Single    Spouse name: N/A  . Number of children: N/A  . Years of education: N/A   Occupational History  . Not on file.   Social History Main Topics  . Smoking status: Never Smoker  . Smokeless tobacco: Never Used  . Alcohol use No  . Drug use: No  . Sexual activity: Not on file   Other Topics Concern  . Not on file   Social History Narrative   Single.   [place]     Past Surgical History:  Procedure Laterality Date  . BYPASS GRAFT ANGIOGRAPHY N/A 12/05/2016   Procedure: Bypass Graft Angiography;  Surgeon: End, CNelva Bush [facility];  Service: Cardiovascular;  Laterality: N/A;  . CARDIAC CATHETERIZATION    . CORONARY ANGIOPLASTY    . CORONARY ARTERY BYPASS GRAFT  12/2015   CABG x 2  . LEFT HEART CATH AND CORONARY ANGIOGRAPHY N/A 12/05/2016   Procedure: Left Heart Cath and Coronary Angiography;  Surgeon: End, CNelva Bush [facility];  Service: Cardiovascular;  Laterality: N/A;    Family History  Problem Relation Age of Onset  . Heart attack Sister   . Arrhythmia Brother  s/p AICD  . Cardiomyopathy Brother   . Diabetes type II Brother     Allergies  Allergen Reactions  . Shellfish Allergy     Face swelling    Current Outpatient Prescriptions on File Prior to Visit  Medication Sig Dispense Refill  . aspirin EC 81 MG tablet Take 81 mg by mouth daily.    Marland Kitchen atorvastatin (LIPITOR) 20 MG tablet Take 1 tablet (20 mg total) by mouth daily. 90 tablet 3  . carvedilol (COREG) 12.5 MG tablet Take 1 tablet (12.5 mg total) by mouth 2 (two) times daily with a meal. 180 tablet 3  . hydrALAZINE (APRESOLINE) 25 MG tablet Take 1 tablet (25 mg total) by mouth 3 (three) times  daily. 270 tablet 3  . insulin aspart (NOVOLOG FLEXPEN) 100 UNIT/ML FlexPen Inject 10 units to the skin before each meal for blood sugars greater than 130. 15 mL 5  . Insulin Glargine (LANTUS) 100 UNIT/ML Solostar Pen Inject 22 Units into the skin every evening. 15 mL 5  . Insulin Pen Needle 32G X 4 MM MISC Use as directed to injection insulin daily. 100 each 5  . isosorbide mononitrate (IMDUR) 60 MG 24 hr tablet Take 1 tablet (60 mg total) by mouth daily. 90 tablet 3  . latanoprost (XALATAN) 0.005 % ophthalmic solution Place 1 drop into both eyes at bedtime.    . magnesium oxide (MAG-OX) 400 (241.3 Mg) MG tablet TAKE 1 TABLET (400 MG TOTAL) BY MOUTH DAILY. 90 tablet 1  . pantoprazole (PROTONIX) 40 MG tablet Take 40 mg by mouth daily.  1  . Potassium Chloride ER 20 MEQ TBCR Take 2 tablets (40 mEq) by mouth once a day. 60 tablet 3  . sacubitril-valsartan (ENTRESTO) 49-51 MG Take 1 tablet by mouth 2 (two) times daily. 180 tablet 3  . tiZANidine (ZANAFLEX) 4 MG tablet Take 1 tablet (4 mg total) by mouth every 8 (eight) hours as needed for muscle spasms. 30 tablet 0  . torsemide (DEMADEX) 20 MG tablet Take 1 tablet (20 mg total) by mouth daily. 60 tablet 1   No current facility-administered medications on file prior to visit.     BP 120/76   Pulse 68   Temp 98.4 F (36.9 C) (Oral)   Ht _0  (1.575 m)   Wt 158 lb 1.9 oz (71.7 kg)   SpO2 97%   BMI 28.92 kg/m    Objective:   Physical Exam  Constitutional: She appears well-nourished.  Neck: Neck supple.  Cardiovascular: Normal rate and regular rhythm.   Pulmonary/Chest: Effort normal and breath sounds normal.  Skin: Skin is warm and dry.  Psychiatric: She has a normal mood and affect.          Assessment & Plan:

## 2017-05-13 NOTE — Patient Instructions (Signed)
Start Novolog insulin (fast acting/meal time insulin). Inject 10 units into the skin before breakfast, lunch, dinner for blood sugars above 130.  Continue Lantus 22 units at bedtime.  Stop taking Glipizide tablets.  Please call me if you get blood sugar readings below 100.  [place] in [place] check your A1C in 2 months.  Please schedule a follow up visit with [facility].   It was a pleasure to see you today!  Diabetes Mellitus and Food It is important for you to manage your blood sugar (glucose) level. Your blood glucose level can be greatly affected by what you eat. Eating healthier foods in the appropriate amounts throughout the day at about the same time each day will help you control your blood glucose level. It can also help slow or prevent worsening of your diabetes mellitus. Healthy eating may even help you improve the level of your blood pressure and reach or maintain a healthy weight. General recommendations for healthful eating and cooking habits include:  Eating meals and snacks regularly. Avoid going long periods of time without eating to lose weight.  Eating a diet that consists mainly of plant-based foods, such as fruits, vegetables, nuts, legumes, and whole grains.  Using low-heat cooking methods, such as baking, instead of high-heat cooking methods, such as deep frying.  Work with your dietitian to make sure you understand how to use the Nutrition Facts information on food labels. How can food affect me? Carbohydrates Carbohydrates affect your blood glucose level more than any other type of food. Your dietitian will help you determine how many carbohydrates to eat at each meal and teach you how to count carbohydrates. Counting carbohydrates is important to keep your blood glucose at a healthy level, especially if you are using insulin or taking certain medicines for diabetes mellitus. Alcohol Alcohol can cause sudden decreases in blood  glucose (hypoglycemia), especially if you use insulin or take certain medicines for diabetes mellitus. Hypoglycemia can be a life-threatening condition. Symptoms of hypoglycemia (sleepiness, dizziness, and disorientation) are similar to symptoms of having too much alcohol. If your health care provider has given you approval to drink alcohol, do so in moderation and use the following guidelines:  Women should not have more than one drink per day, and men should not have more than two drinks per day. One drink is equal to: ? 12 oz of beer. ? 5 oz of wine. ? 1 oz of hard liquor.  Do not drink on an empty stomach.  Keep yourself hydrated. Have water, diet soda, or unsweetened iced tea.  Regular soda, juice, and other mixers might contain a lot of carbohydrates and should be counted.  What foods are not recommended? As you make food choices, it is important to remember that all foods are not the same. Some foods have fewer nutrients per serving than other foods, even though they might have the same number of calories or carbohydrates. It is difficult to get your body what it needs when you eat foods with fewer nutrients. Examples of foods that you should avoid that are high in calories and carbohydrates but low in nutrients include:  Trans fats (most processed foods list trans fats on the Nutrition Facts label).  Regular soda.  Juice.  Candy.  Sweets, such as cake, pie, doughnuts, and cookies.  Fried foods.  What foods can I eat? Eat nutrient-rich foods, which will nourish your body and keep you healthy. The food you should eat  also will depend on several factors, including:  The calories you need.  The medicines you take.  Your weight.  Your blood glucose level.  Your blood pressure level.  Your cholesterol level.  You should eat a variety of foods, including:  Protein. ? Lean cuts of meat. ? Proteins low in saturated fats, such as fish, egg whites, and beans. Avoid  processed meats.  Fruits and vegetables. ? Fruits and vegetables that may help control blood glucose levels, such as apples, mangoes, and yams.  Dairy products. ? Choose fat-free or low-fat dairy products, such as milk, yogurt, and cheese.  Grains, bread, pasta, and rice. ? Choose whole grain products, such as multigrain bread, whole oats, and brown rice. These foods may help control blood pressure.  Fats. ? Foods containing healthful fats, such as nuts, avocado, olive oil, canola oil, and fish.  Does everyone with diabetes mellitus have the same meal plan? Because every person with diabetes mellitus is different, there is not one meal plan that works for everyone. It is very important that you meet with a dietitian who will help you create a meal plan that is just right for you. This information is not intended to replace advice given to you by your health care provider. Make sure you discuss any questions you have with your health care provider. Document Released: 04/10/2005 Document Revised: 12/20/2015 Document Reviewed: 06/10/2013 Elsevier Interactive Patient Education  2017 [employer].

## 2017-05-18 ENCOUNTER — Ambulatory Visit (INDEPENDENT_AMBULATORY_CARE_PROVIDER_SITE_OTHER): Payer: Medicare Other | Admitting: Podiatry

## 2017-05-18 DIAGNOSIS — M79676 Pain in unspecified toe(s): Secondary | ICD-10-CM | POA: Diagnosis not present

## 2017-05-18 DIAGNOSIS — B351 Tinea unguium: Secondary | ICD-10-CM | POA: Diagnosis not present

## 2017-05-18 DIAGNOSIS — E1142 Type 2 diabetes mellitus with diabetic polyneuropathy: Secondary | ICD-10-CM

## 2017-05-18 DIAGNOSIS — L608 Other nail disorders: Secondary | ICD-10-CM

## 2017-05-18 NOTE — Progress Notes (Addendum)
Complaint:  Visit Type: Patient returns to my office for continued preventative foot care services. Complaint: Patient states" my nails have grown long and thick and become painful to walk and wear shoes" Patient has been diagnosed with DM with neuropathy.. The patient presents for preventative foot care services. No changes to ROS.  She never made an appointment with [facility] for diabetic shoes.  Podiatric Exam: Vascular: dorsalis pedis and posterior tibial pulses are palpable bilateral. Capillary return is immediate. Temperature gradient is WNL. Skin turgor WNL  Sensorium: Normal Semmes Weinstein monofilament test. Normal tactile sensation bilaterally. Nail Exam: Pt has thick disfigured discolored nails with subungual debris noted bilateral entire nail hallux through fifth toenails.  Pincer nails noted. Ulcer Exam: There is no evidence of ulcer or pre-ulcerative changes or infection. Orthopedic Exam: Muscle tone and strength are WNL. No limitations in general ROM. No crepitus or effusions noted. Foot type and digits show no abnormalities. HAV  B/L Skin: No Porokeratosis. No infection or ulcers  Diagnosis:  Onychomycosis, , Pain in right toe, pain in left toes  Treatment & Plan Procedures and Treatment: Consent by patient was obtained for treatment procedures.   Debridement of mycotic and hypertrophic toenails, 1 through 5 bilateral and clearing of subungual debris. No ulceration, no infection noted. ABN signed  For 2018. Return Visit-Office Procedure: Patient instructed to return to the office for a follow up visit 3 months for continued evaluation and treatment.    Helane GuntherGregory Marcelus Dubberly DPM

## 2017-05-21 ENCOUNTER — Other Ambulatory Visit: Payer: Self-pay | Admitting: Physician Assistant

## 2017-05-21 NOTE — Telephone Encounter (Signed)
Per 05/12/2017 it looks like we sent in a refill for Torsemide 20MG  daily. Please advise which dose and instructions I should refill, thank you!

## 2017-06-11 ENCOUNTER — Telehealth: Payer: Self-pay | Admitting: Primary Care

## 2017-06-11 NOTE — Telephone Encounter (Signed)
Megan Howard, This patient spends part of her time in [place] and part of her time in [place]. She is in [place] now and is having difficulty seeing her PCP their as she doesn't have medical records from our office. She signed a medical release form in mid October during her visit. Will you please call the daughter, Camelia Engerri, at [phone] and help them. The daughter lives in [place] and handles her mothers care.

## 2017-06-12 NOTE — Telephone Encounter (Signed)
I called [facility] and confirmed 41 pgs of records were faxed on 05/22/17. I called daughter Camelia Engerri and gave [facility] direct number to give to [place] office for request as suggested by Bonita QuinLinda at [facility].

## 2017-07-27 ENCOUNTER — Encounter: Payer: Self-pay | Admitting: Primary Care

## 2017-07-27 ENCOUNTER — Telehealth: Payer: Self-pay | Admitting: Primary Care

## 2017-07-27 NOTE — Telephone Encounter (Signed)
Received faxed refill request for magnesium oxide (MAG-OX) 400 (241.3 Mg) MG tablet  Last prescribed on 12/31/2016. Last seen on 05/13/2017

## 2017-07-29 ENCOUNTER — Other Ambulatory Visit: Payer: Self-pay | Admitting: Internal Medicine

## 2017-07-29 ENCOUNTER — Ambulatory Visit: Payer: Medicare Other | Admitting: Internal Medicine

## 2017-08-04 NOTE — Telephone Encounter (Signed)
Sent patient message via my chart several days ago.  Will speak with daughter regarding refill of magnesium.

## 2017-08-06 ENCOUNTER — Encounter: Payer: Self-pay | Admitting: Primary Care

## 2017-08-29 ENCOUNTER — Other Ambulatory Visit: Payer: Self-pay | Admitting: Internal Medicine

## 2017-12-26 IMAGING — CR DG CHEST 2V
2 series · 2 of 2 positions shown · non-contrast
Comparison: None.

CLINICAL DATA: Dyspnea

EXAM:
CHEST  2 VIEW

[chest pa]
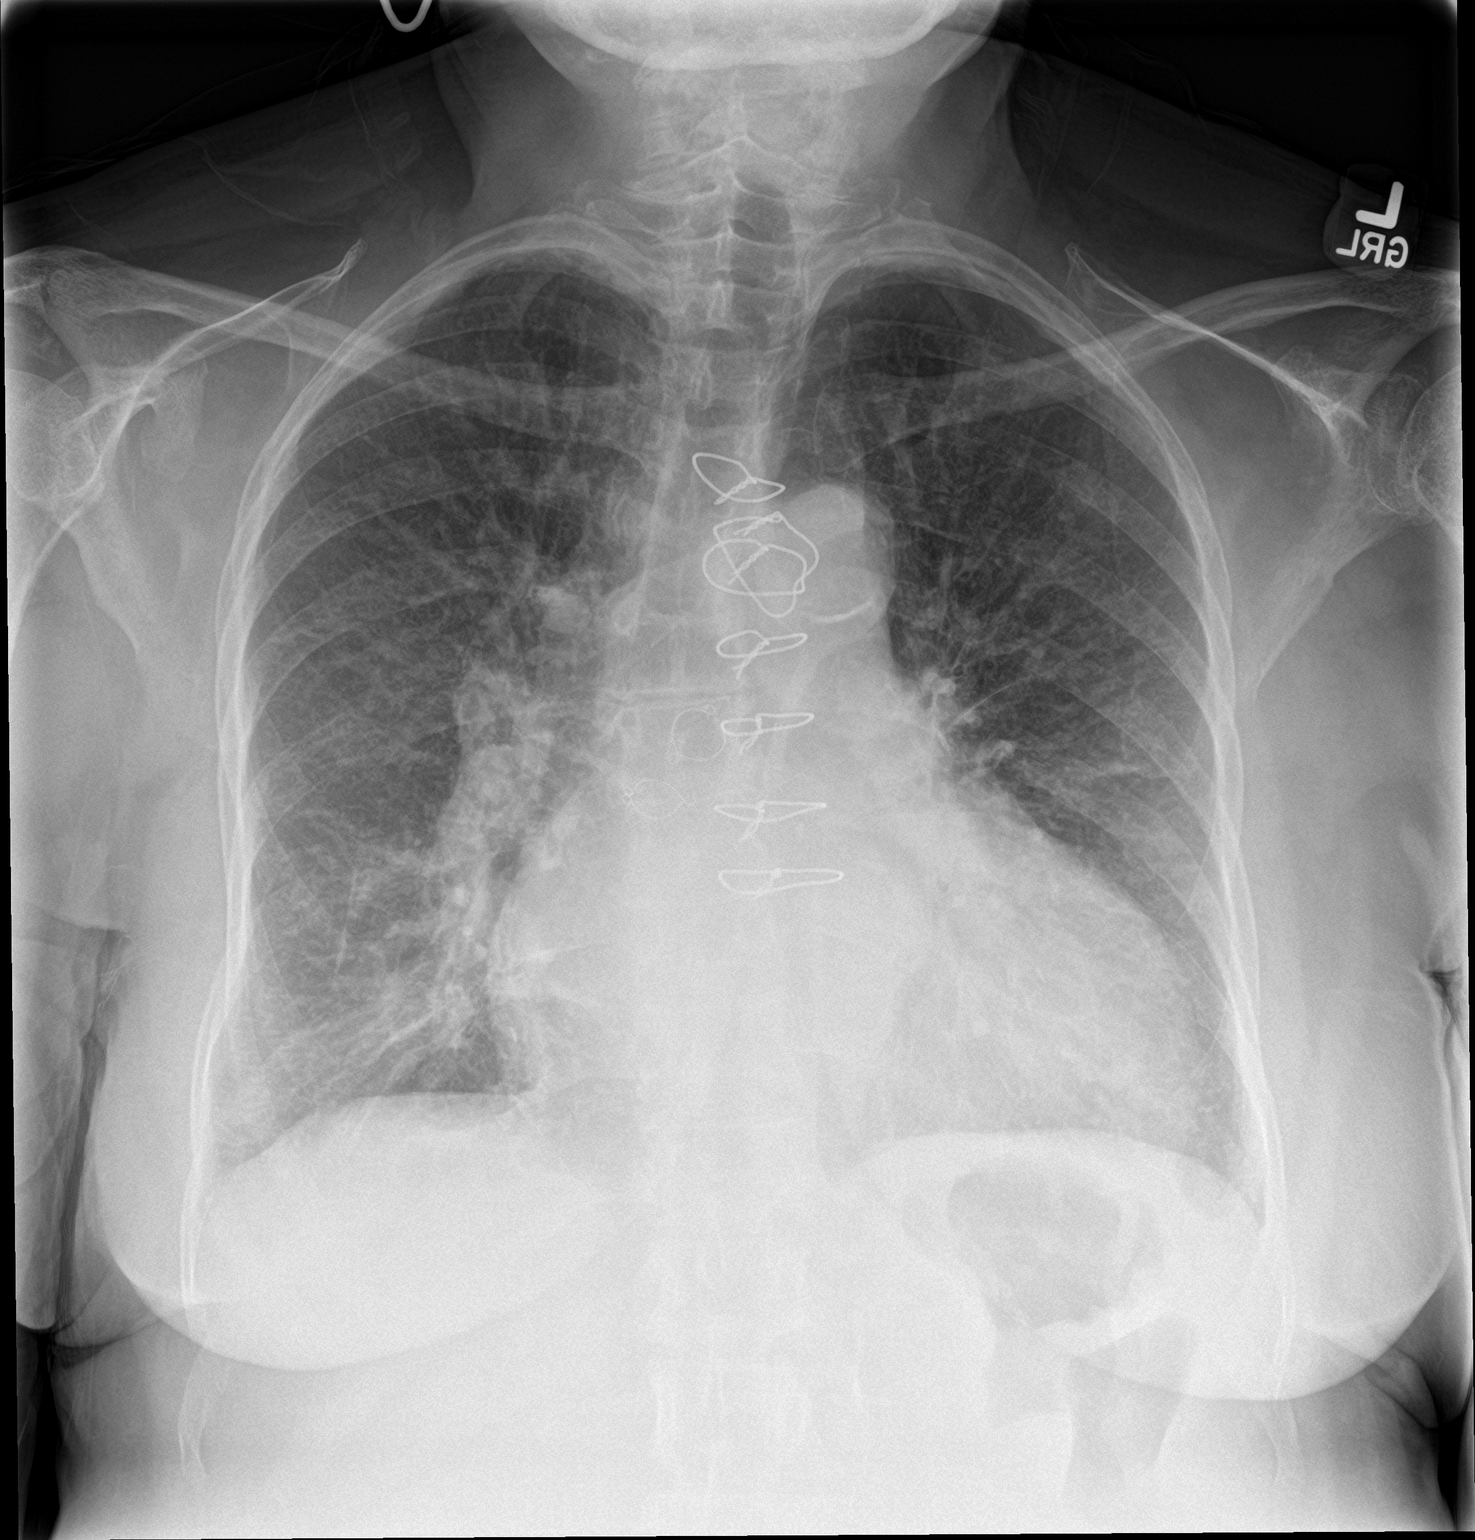

[chest lat]
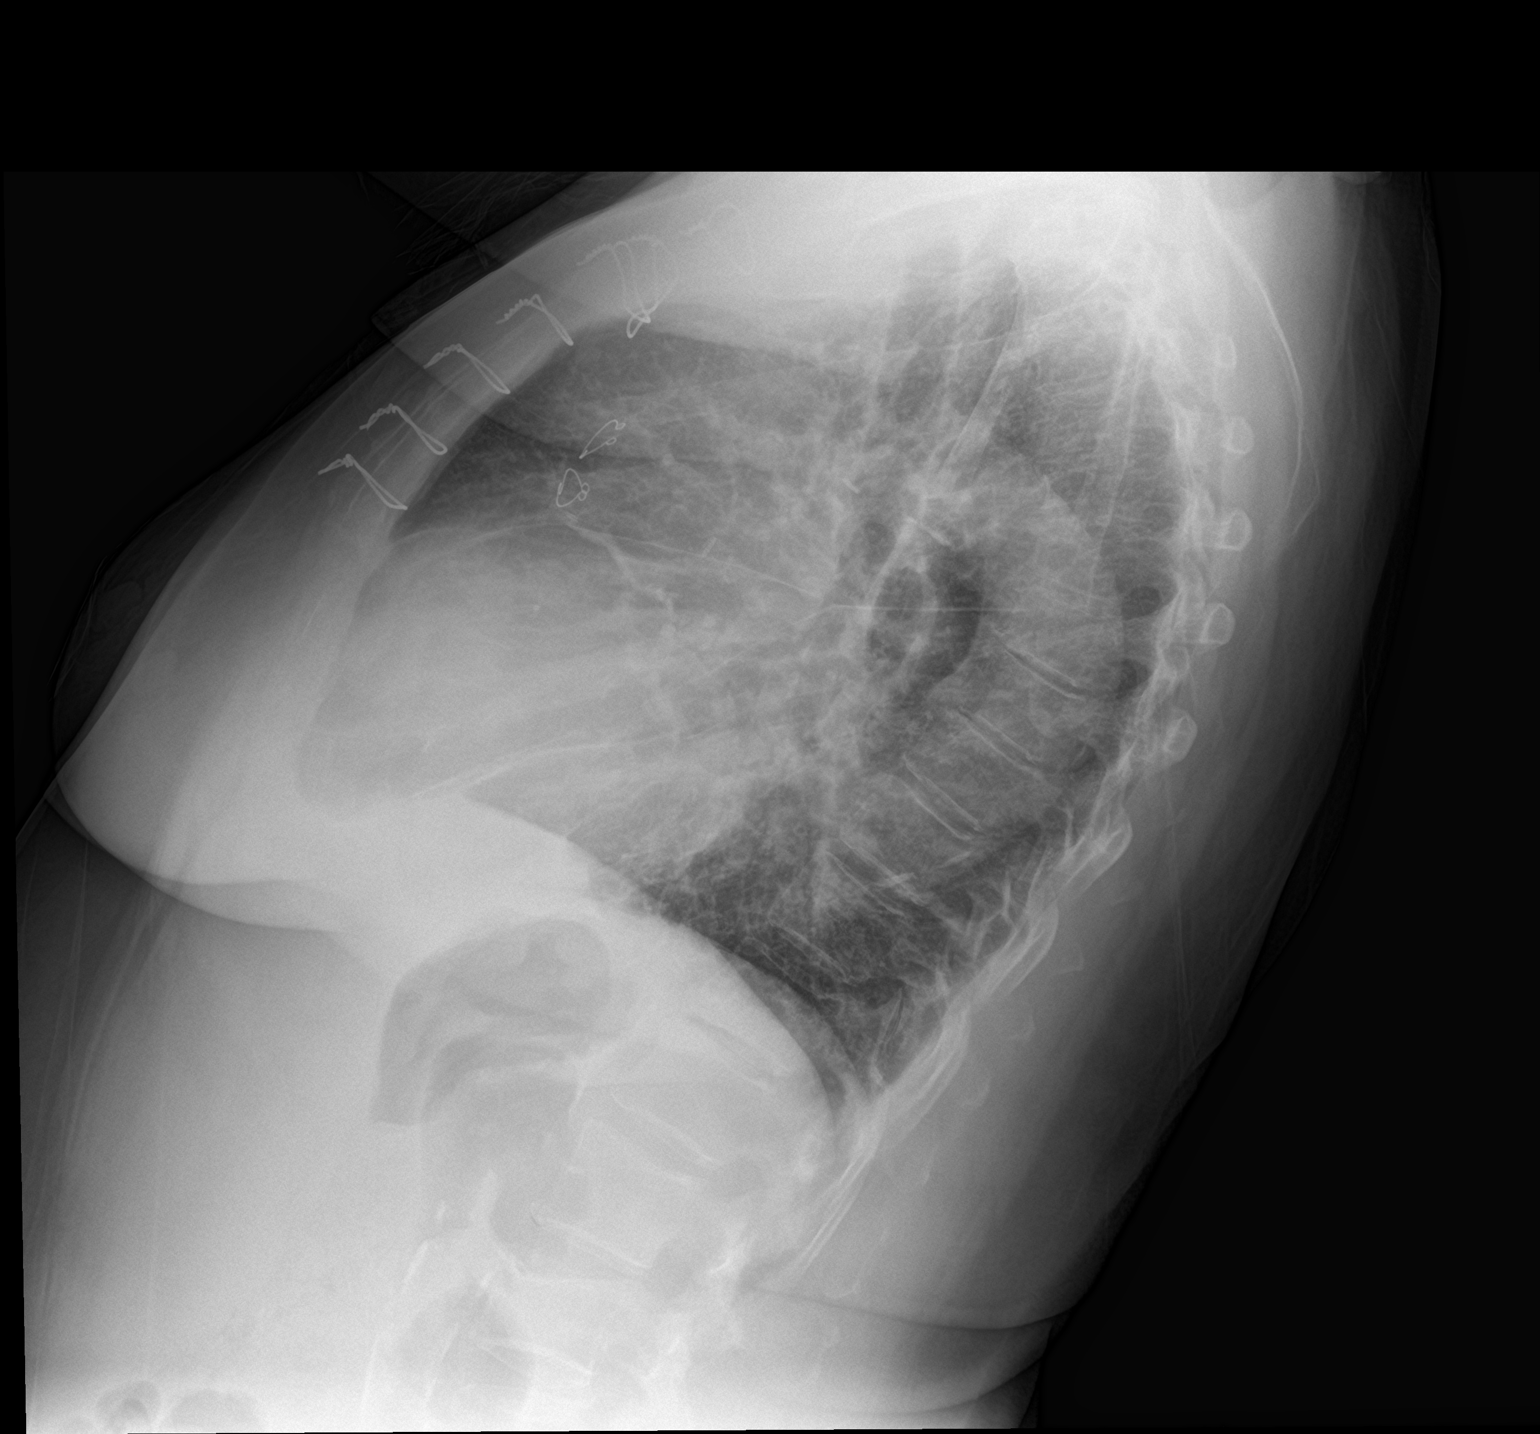

[2 of 2 positions shown; findings below may reference images not displayed]

FINDINGS: Median sternotomy wires appear intact. Enlargement of the
cardiopericardial silhouette. Aortic atherosclerosis. Otherwise
normal mediastinal contour. No pneumothorax. No pleural effusion.
Mild pulmonary edema. No acute consolidative airspace disease.
IMPRESSION: Mild congestive heart failure.

Aortic atherosclerosis.

## 2018-04-19 IMAGING — DX DG HIP (WITH OR WITHOUT PELVIS) 2V BILAT
5 series · 5 of 5 positions shown · non-contrast
Comparison: No recent prior.

CLINICAL DATA: Left lower back pain. Bilateral hip pain. Prior
fall.

EXAM:
DG HIP (WITH OR WITHOUT PELVIS) 2V BILAT

[pelvis ap]
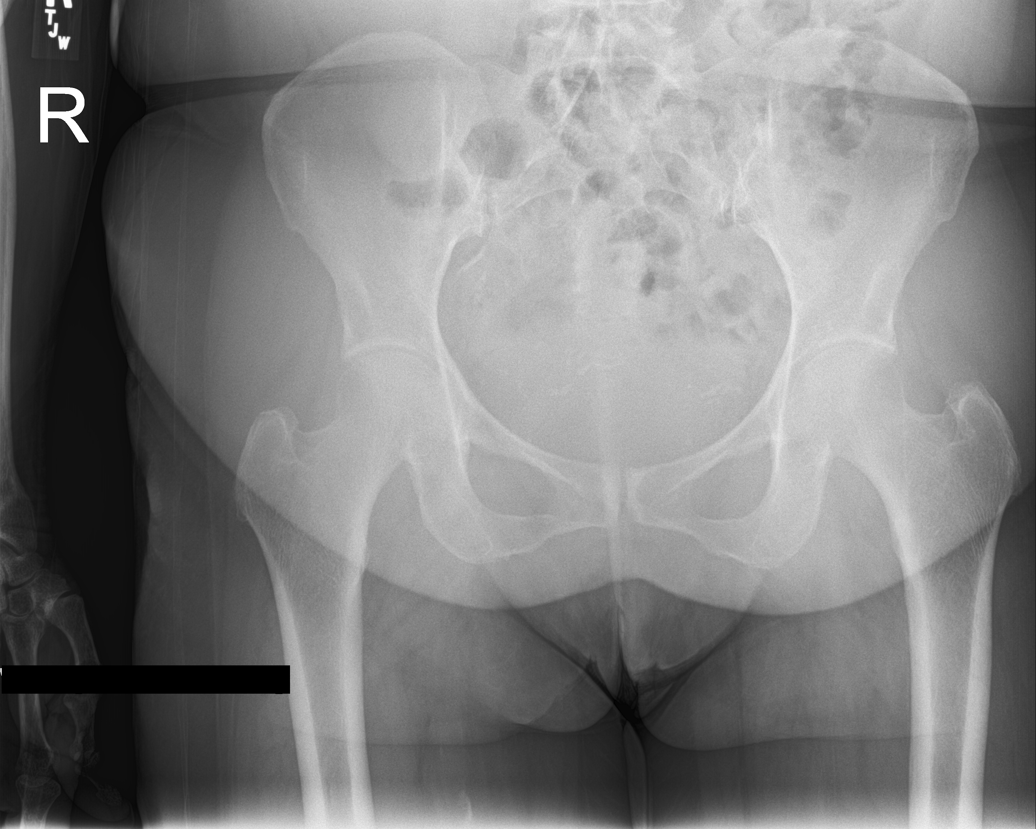

[hip ap (1 of 2)]
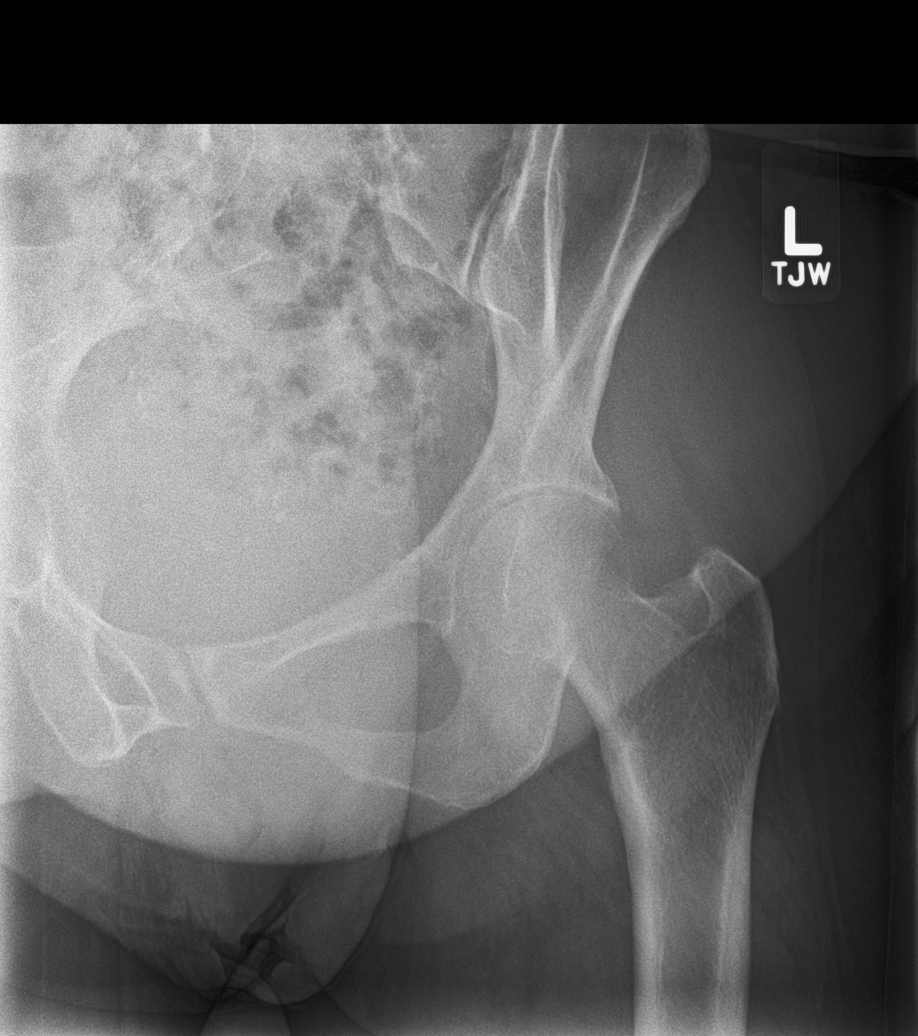

[hip lat (1 of 2)]
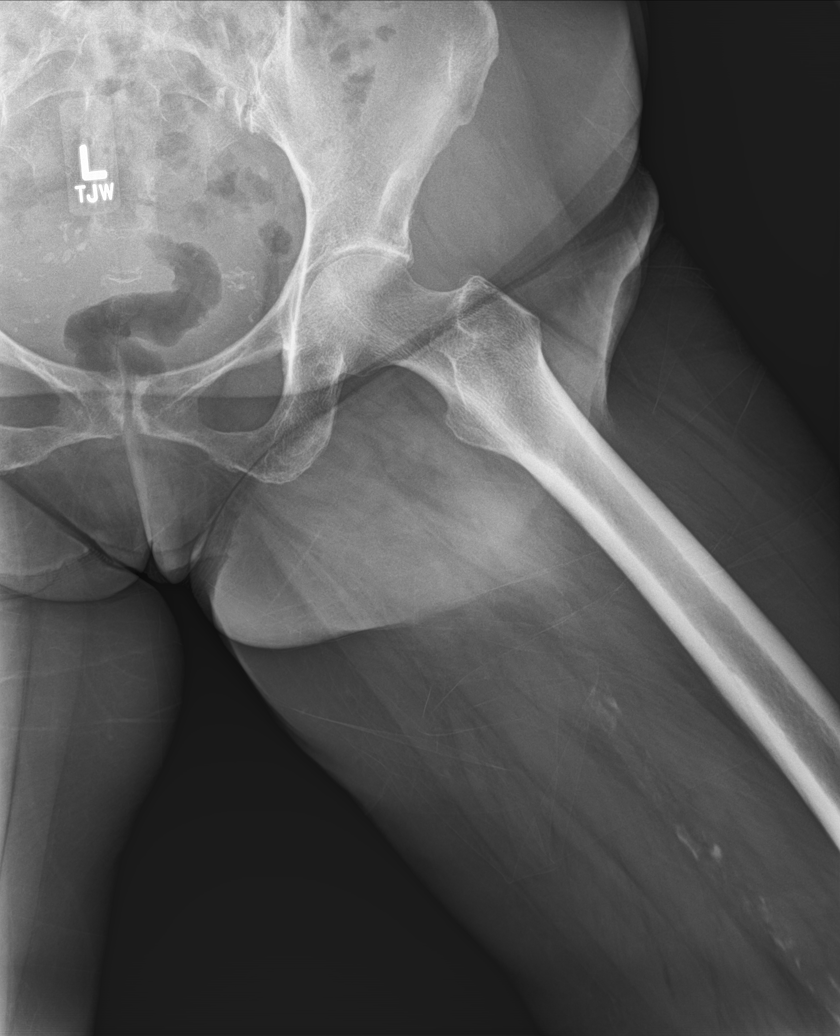

[hip ap (2 of 2)]
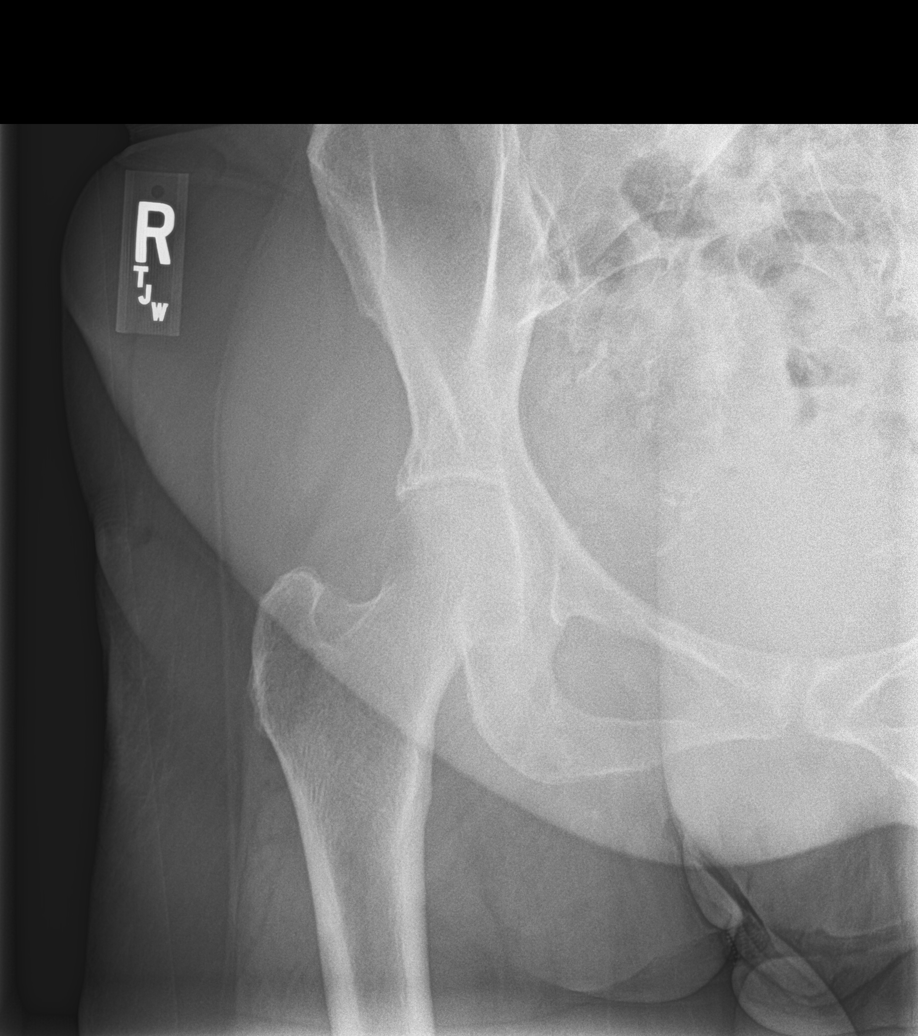

[hip lat (2 of 2)]
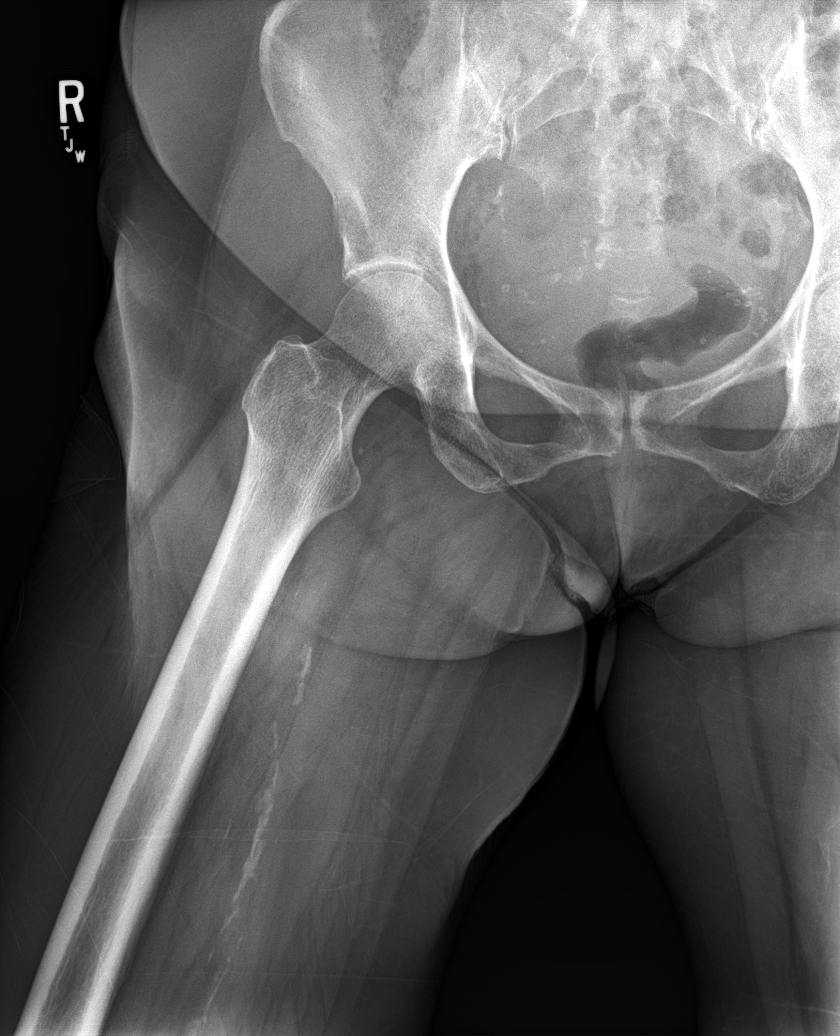

[5 of 5 positions shown; findings below may reference images not displayed]

FINDINGS: Mild degenerative changes both hips. No acute bony or joint
abnormality identified. No evidence of fracture dislocation.
Aortoiliac atherosclerotic vascular calcification. Peripheral
vascular calcification.
IMPRESSION: 1. Mild degenerative changes lumbar spine and both hips. No acute
abnormality identified.

2. Aortoiliac atherosclerotic vascular disease. Peripheral vascular
disease.

## 2018-04-19 IMAGING — DX DG LUMBAR SPINE COMPLETE 4+V
5 series · 5 of 5 positions shown · non-contrast
Comparison: Chest x-ray 11/24/2016

CLINICAL DATA: Back pain.

EXAM:
LUMBAR SPINE - COMPLETE 4+ VIEW

[l-spine ap]
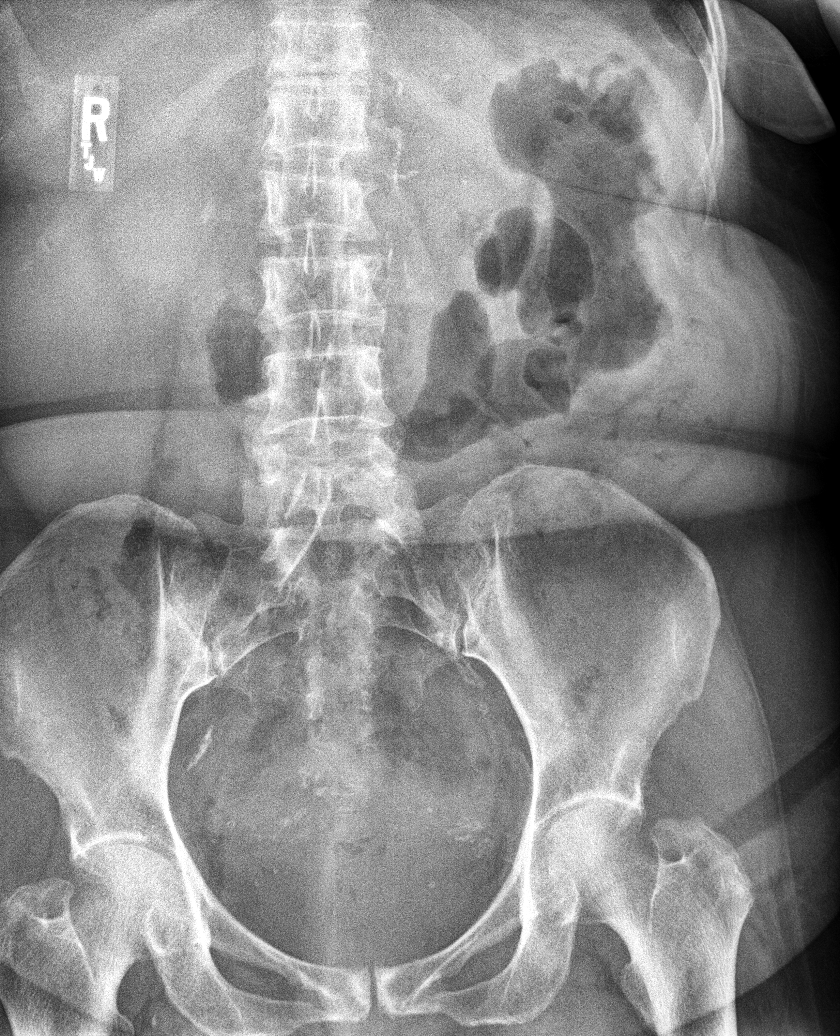

[l-spine obl (1 of 2)]
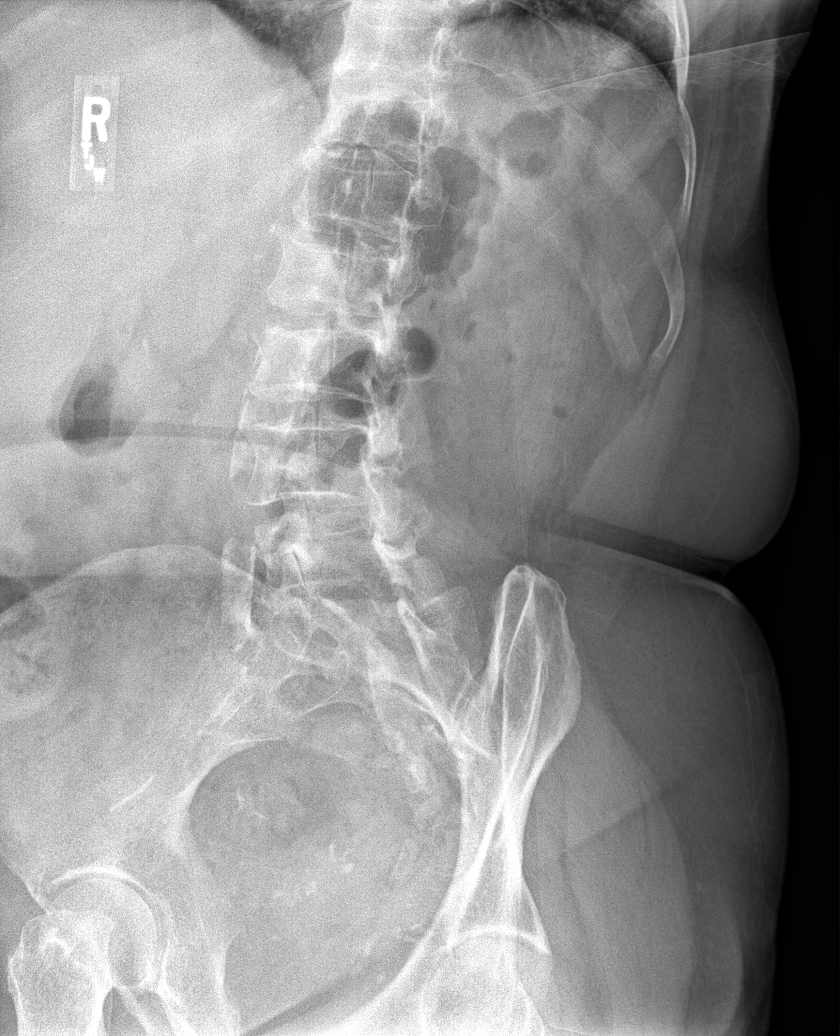

[l-spine obl (2 of 2)]
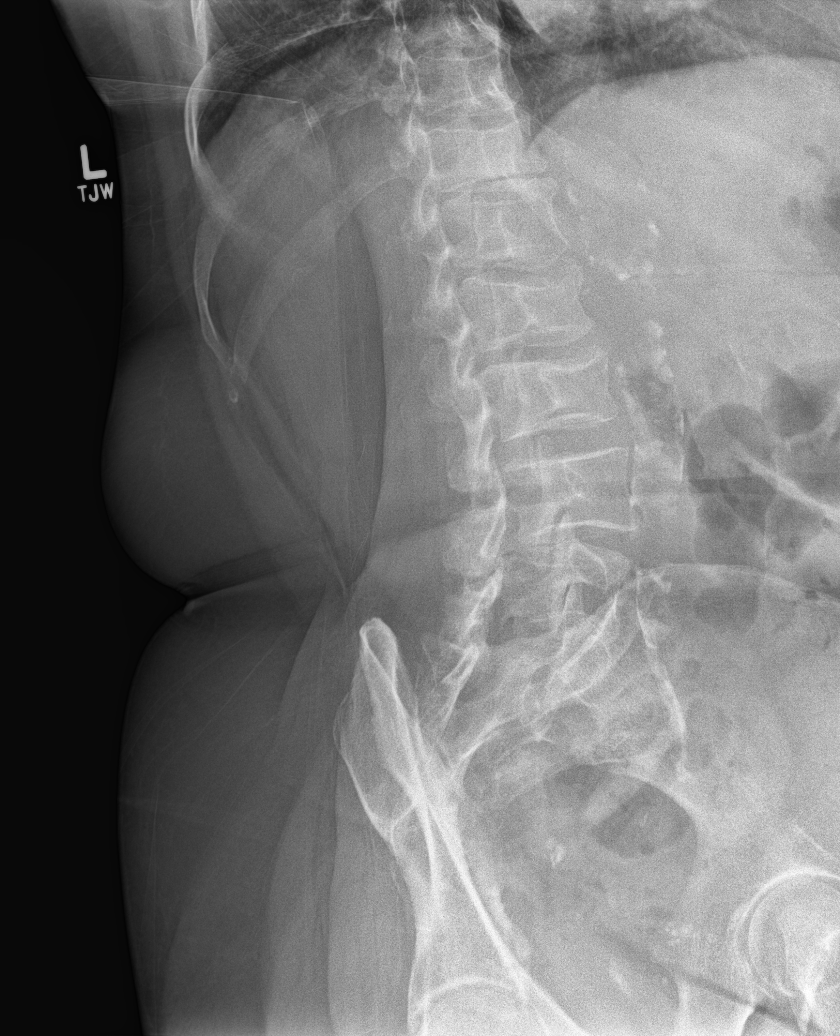

[l-spine lat]
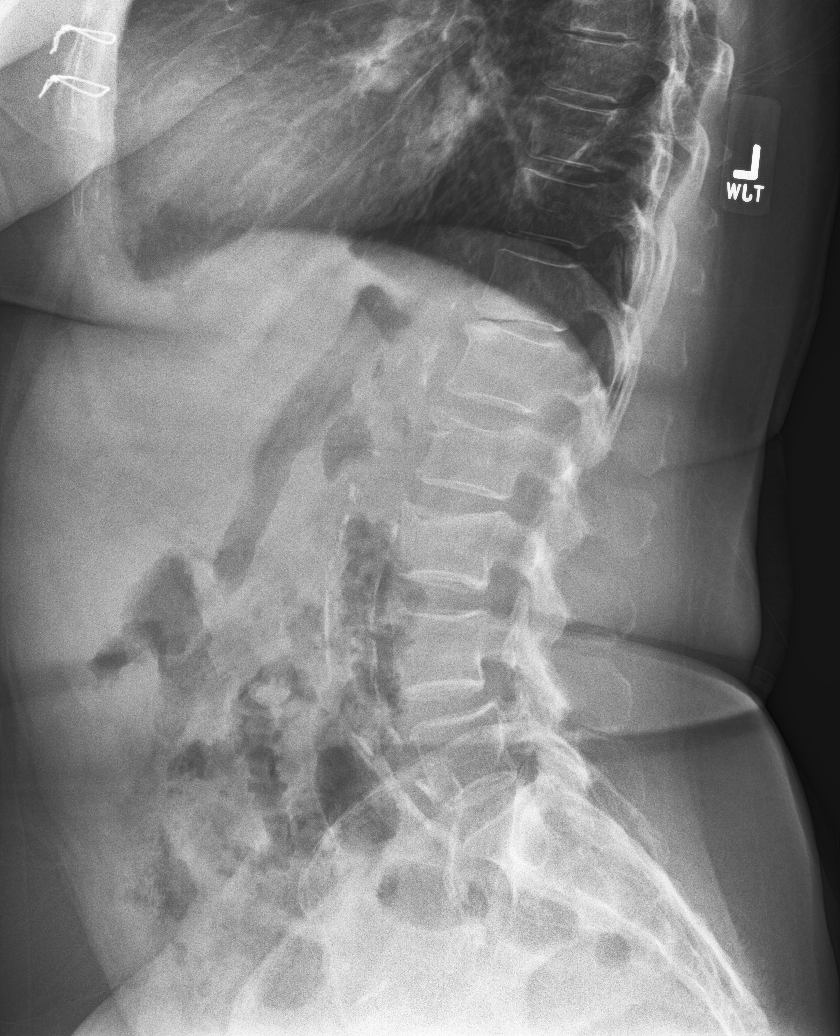

[l-spine l5/s1]
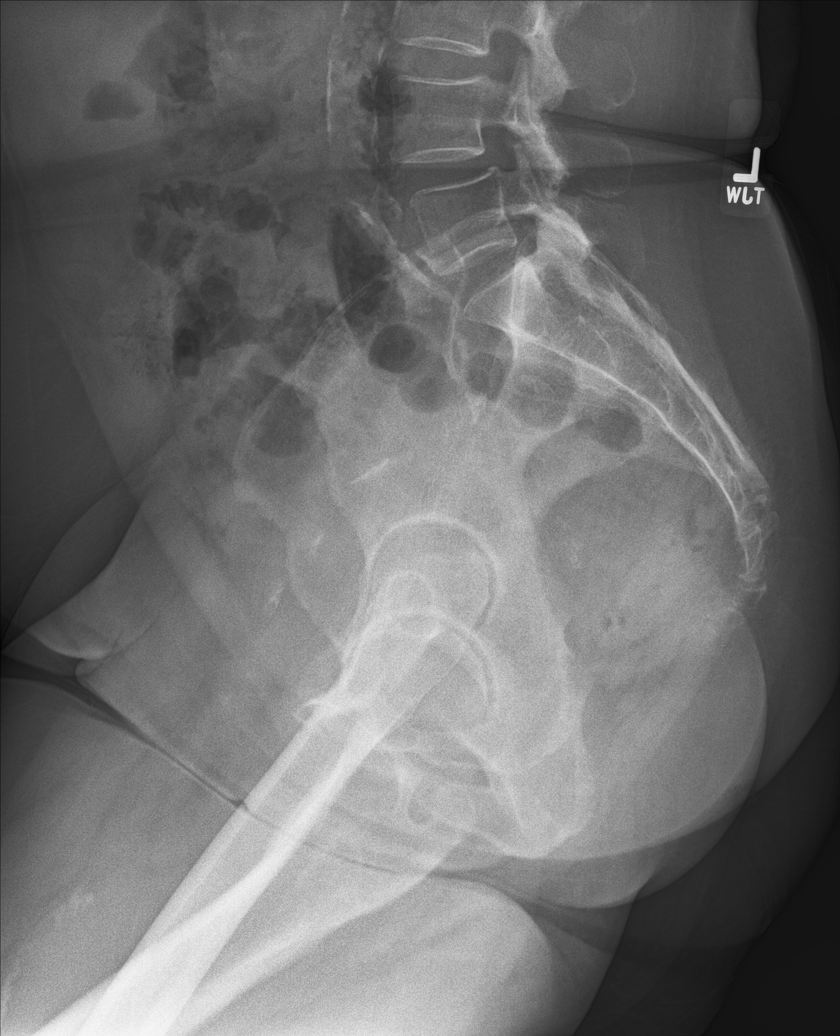

[5 of 5 positions shown; findings below may reference images not displayed]

FINDINGS: Lumbar vertebra number with the lowest segmented appearing lumbar
shaped vertebra on lateral view as L5 . Degenerative changes lumbar
spine and both hips. Stable L3 mild compression fracture. No acute
bony or joint abnormality identified. 4 mm anterolisthesis L4 on L5
. Aortoiliac and renal vascular calcification.
IMPRESSION: 1. Diffuse degenerative change lumbar spine. 4 mm anterolisthesis L4
on L5.

2. Stable L3 mild compression fracture.

3. Aortoiliac and bilateral renal atherosclerotic vascular disease.

## 2018-06-02 ENCOUNTER — Other Ambulatory Visit: Payer: Self-pay | Admitting: Internal Medicine

## 2018-06-02 NOTE — Telephone Encounter (Signed)
Patient needs a follow up appointment before refills given.

## 2018-06-03 ENCOUNTER — Telehealth: Payer: Self-pay | Admitting: Internal Medicine

## 2018-06-03 NOTE — Telephone Encounter (Signed)
Lmov for patient to call and schedule appointment ° °

## 2018-06-03 NOTE — Telephone Encounter (Signed)
-----   Message from Festus Aloe, CMA sent at 06/02/2018  2:56 PM EST ----- Please contact patient for a follow up appointment. The patient is requesting refills on medications. He has canceled and no showed.  Thanks, Jasmine December

## 2018-06-07 NOTE — Telephone Encounter (Signed)
Lmov for patient to call and schedule appointment ° °

## 2018-06-08 NOTE — Telephone Encounter (Signed)
Lmov for patient to call and schedule appointment ° °

## 2018-06-09 ENCOUNTER — Encounter: Payer: Self-pay | Admitting: Internal Medicine

## 2018-06-09 NOTE — Telephone Encounter (Signed)
Sent Letter

## 2018-06-16 NOTE — Telephone Encounter (Signed)
Spoke with Jetty Peekserri Totten the patient's daughter regarding a refill request on Entresto. The patient has recently moved back to [place]. The daughter will contact the Ms. Salmons to verify that she is receiving her medication in [place].

## 2018-06-20 ENCOUNTER — Other Ambulatory Visit: Payer: Self-pay | Admitting: Internal Medicine

## 2018-07-17 ENCOUNTER — Other Ambulatory Visit: Payer: Self-pay | Admitting: Internal Medicine

## 2018-07-19 NOTE — Telephone Encounter (Signed)
Called and spoke with patient's daughter, ok per DPR. States patient has moved back home with is out of state and does not need the refill from us at this time. She was appreciative. Refusing prescription as patient has care out of state.

## 2018-07-19 NOTE — Telephone Encounter (Signed)
Mr. Megan Howard is requesting a refill on Torsemide.  The patient was last seen in 03/2017 and cancelled his follow up appointment on 07/2017.  Please review for refill.

## 2023-05-29 DEATH — deceased
